# Patient Record
Sex: Female | Born: 1955 | Race: White | Hispanic: No | Marital: Single | Smoking: Current every day smoker
Health system: Southern US, Community
[De-identification: ages and names within clinical notes are randomized; demographics above are authoritative.]

## PROBLEM LIST (undated history)

## (undated) DIAGNOSIS — F319 Bipolar disorder, unspecified: Secondary | ICD-10-CM

## (undated) DIAGNOSIS — E039 Hypothyroidism, unspecified: Secondary | ICD-10-CM

## (undated) DIAGNOSIS — I10 Essential (primary) hypertension: Secondary | ICD-10-CM

## (undated) HISTORY — PX: BREAST BIOPSY: SHX20

## (undated) HISTORY — PX: ABDOMINAL HYSTERECTOMY: SHX81

## (undated) HISTORY — PX: OOPHORECTOMY: SHX86

## (undated) HISTORY — PX: TOTAL THYROIDECTOMY: SHX2547

---

## 2005-01-07 ENCOUNTER — Ambulatory Visit: Payer: Self-pay

## 2005-02-19 ENCOUNTER — Other Ambulatory Visit: Payer: Self-pay

## 2005-03-03 ENCOUNTER — Ambulatory Visit: Payer: Self-pay | Admitting: Unknown Physician Specialty

## 2005-03-05 ENCOUNTER — Ambulatory Visit: Payer: Self-pay | Admitting: Unknown Physician Specialty

## 2007-10-03 ENCOUNTER — Emergency Department: Payer: Self-pay | Admitting: Emergency Medicine

## 2010-02-21 ENCOUNTER — Emergency Department: Payer: Self-pay | Admitting: Internal Medicine

## 2010-08-05 ENCOUNTER — Ambulatory Visit: Payer: Self-pay | Admitting: Family Medicine

## 2010-08-19 ENCOUNTER — Ambulatory Visit: Payer: Self-pay | Admitting: Family Medicine

## 2010-08-20 ENCOUNTER — Encounter: Payer: Self-pay | Admitting: Family Medicine

## 2010-08-25 ENCOUNTER — Emergency Department: Payer: Self-pay | Admitting: Emergency Medicine

## 2010-09-14 ENCOUNTER — Encounter: Payer: Self-pay | Admitting: Family Medicine

## 2011-01-09 ENCOUNTER — Ambulatory Visit: Payer: Self-pay | Admitting: Family Medicine

## 2011-01-22 ENCOUNTER — Ambulatory Visit: Payer: Self-pay | Admitting: Specialist

## 2011-03-10 ENCOUNTER — Ambulatory Visit: Payer: Self-pay | Admitting: Family Medicine

## 2011-04-06 ENCOUNTER — Encounter: Payer: Self-pay | Admitting: Family Medicine

## 2011-04-15 ENCOUNTER — Encounter: Payer: Self-pay | Admitting: Family Medicine

## 2011-05-16 ENCOUNTER — Encounter: Payer: Self-pay | Admitting: Family Medicine

## 2011-08-18 ENCOUNTER — Ambulatory Visit: Payer: Self-pay | Admitting: Specialist

## 2012-03-01 ENCOUNTER — Ambulatory Visit: Payer: Self-pay | Admitting: Specialist

## 2012-09-16 ENCOUNTER — Ambulatory Visit: Payer: Self-pay | Admitting: Specialist

## 2013-07-27 DIAGNOSIS — G8929 Other chronic pain: Secondary | ICD-10-CM | POA: Diagnosis present

## 2013-07-27 DIAGNOSIS — E039 Hypothyroidism, unspecified: Secondary | ICD-10-CM | POA: Diagnosis present

## 2013-07-27 DIAGNOSIS — J449 Chronic obstructive pulmonary disease, unspecified: Secondary | ICD-10-CM | POA: Insufficient documentation

## 2013-10-17 ENCOUNTER — Ambulatory Visit: Payer: Self-pay | Admitting: Anesthesiology

## 2013-10-25 ENCOUNTER — Ambulatory Visit: Payer: Self-pay | Admitting: Anesthesiology

## 2013-11-15 ENCOUNTER — Ambulatory Visit: Payer: Self-pay | Admitting: Anesthesiology

## 2013-12-25 ENCOUNTER — Ambulatory Visit: Payer: Self-pay | Admitting: Anesthesiology

## 2014-02-22 ENCOUNTER — Ambulatory Visit: Payer: Self-pay | Admitting: Anesthesiology

## 2014-04-17 ENCOUNTER — Ambulatory Visit: Payer: Self-pay | Admitting: Anesthesiology

## 2014-06-05 DIAGNOSIS — I10 Essential (primary) hypertension: Secondary | ICD-10-CM | POA: Diagnosis present

## 2014-07-23 ENCOUNTER — Ambulatory Visit: Payer: Self-pay | Admitting: Anesthesiology

## 2014-10-11 ENCOUNTER — Ambulatory Visit: Payer: Self-pay | Admitting: Anesthesiology
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
## 2015-05-09 DIAGNOSIS — M792 Neuralgia and neuritis, unspecified: Secondary | ICD-10-CM | POA: Diagnosis present

## 2015-08-03 ENCOUNTER — Emergency Department
Admission: EM | Admit: 2015-08-03 | Discharge: 2015-08-03 | Disposition: A | Discharge status: Home / Self Care | Payer: Medicare Other | Attending: Emergency Medicine | Admitting: Emergency Medicine

## 2015-08-03 ENCOUNTER — Encounter: Payer: Self-pay | Admitting: Emergency Medicine

## 2015-08-03 ENCOUNTER — Emergency Department: Payer: Medicare Other

## 2015-08-03 DIAGNOSIS — R300 Dysuria: Secondary | ICD-10-CM | POA: Diagnosis not present

## 2015-08-03 DIAGNOSIS — R109 Unspecified abdominal pain: Secondary | ICD-10-CM | POA: Diagnosis present

## 2015-08-03 DIAGNOSIS — F17210 Nicotine dependence, cigarettes, uncomplicated: Secondary | ICD-10-CM | POA: Diagnosis not present

## 2015-08-03 LAB — URINALYSIS COMPLETE WITH MICROSCOPIC (ARMC ONLY)
BILIRUBIN URINE: NEGATIVE
Bacteria, UA: NONE SEEN
Glucose, UA: NEGATIVE mg/dL
Hgb urine dipstick: NEGATIVE
KETONES UR: NEGATIVE mg/dL
LEUKOCYTES UA: NEGATIVE
Nitrite: NEGATIVE
PH: 7 (ref 5.0–8.0)
Protein, ur: NEGATIVE mg/dL
Specific Gravity, Urine: 1.001 — ABNORMAL LOW (ref 1.005–1.030)

## 2015-08-03 LAB — BASIC METABOLIC PANEL
Anion gap: 8 (ref 5–15)
BUN: 7 mg/dL (ref 6–20)
CO2: 24 mmol/L (ref 22–32)
Calcium: 9.5 mg/dL (ref 8.9–10.3)
Chloride: 107 mmol/L (ref 101–111)
Creatinine, Ser: 0.71 mg/dL (ref 0.44–1.00)
GFR calc Af Amer: >60 mL/min (ref >60)
GFR calc non Af Amer: >60 mL/min (ref >60)
GLUCOSE: 108 mg/dL — AB (ref 65–99)
POTASSIUM: 3.1 mmol/L — AB (ref 3.5–5.1)
Sodium: 139 mmol/L (ref 135–145)

## 2015-08-03 LAB — CBC WITH DIFFERENTIAL/PLATELET
BASOS PCT: 1 %
Basophils Absolute: 0.1 10*3/uL (ref 0–0.1)
EOS ABS: 0.1 10*3/uL (ref 0–0.7)
Eosinophils Relative: 1 %
HEMATOCRIT: 43.2 % (ref 35.0–47.0)
HEMOGLOBIN: 14.2 g/dL (ref 12.0–16.0)
LYMPHS ABS: 1.6 10*3/uL (ref 1.0–3.6)
Lymphocytes Relative: 11 %
MCH: 31.1 pg (ref 26.0–34.0)
MCHC: 33 g/dL (ref 32.0–36.0)
MCV: 94.5 fL (ref 80.0–100.0)
MONO ABS: 0.9 10*3/uL (ref 0.2–0.9)
MONOS PCT: 7 %
Neutro Abs: 11.2 10*3/uL — ABNORMAL HIGH (ref 1.4–6.5)
Neutrophils Relative %: 80 %
Platelets: 294 10*3/uL (ref 150–440)
RBC: 4.58 MIL/uL (ref 3.80–5.20)
RDW: 15 % — AB (ref 11.5–14.5)
WBC: 13.8 10*3/uL — ABNORMAL HIGH (ref 3.6–11.0)

## 2015-08-03 MED ORDER — KETOROLAC TROMETHAMINE 30 MG/ML IJ SOLN
30.0000 mg | Freq: Once | INTRAMUSCULAR | Status: AC
Start: 2015-08-03 — End: 2015-08-03
  Administered 2015-08-03: 30 mg via INTRAVENOUS
  Filled 2015-08-03: qty 1

## 2015-08-03 MED ORDER — IBUPROFEN 800 MG PO TABS: 800.0000 mg | ORAL_TABLET | Freq: Three times a day (TID) | ORAL | Status: AC | PRN

## 2015-08-03 NOTE — Discharge Instructions (Signed)
Flank Pain °Flank pain refers to pain that is located on the side of the body between the upper abdomen and the back. The pain may occur over a short period of time (acute) or may be long-term or reoccurring (chronic). It may be mild or severe. Flank pain can be caused by many things. °CAUSES  °Some of the more common causes of flank pain include: °· Muscle strains.   °· Muscle spasms.   °· A disease of your spine (vertebral disk disease).   °· A lung infection (pneumonia).   °· Fluid around your lungs (pulmonary edema).   °· A kidney infection.   °· Kidney stones.   °· A very painful skin rash caused by the chickenpox virus (shingles).   °· Gallbladder disease.   °HOME CARE INSTRUCTIONS  °Home care will depend on the cause of your pain. In general, °· Rest as directed by your caregiver. °· Drink enough fluids to keep your urine clear or pale yellow. °· Only take over-the-counter or prescription medicines as directed by your caregiver. Some medicines may help relieve the pain. °· Tell your caregiver about any changes in your pain. °· Follow up with your caregiver as directed. °SEEK IMMEDIATE MEDICAL CARE IF:  °· Your pain is not controlled with medicine.   °· You have new or worsening symptoms. °· Your pain increases.   °· You have abdominal pain.   °· You have shortness of breath.   °· You have persistent nausea or vomiting.   °· You have swelling in your abdomen.   °· You feel faint or pass out.   °· You have blood in your urine. °· You have a fever or persistent symptoms for more than 2-3 days. °· You have a fever and your symptoms suddenly get worse. °MAKE SURE YOU:  °· Understand these instructions. °· Will watch your condition. °· Will get help right away if you are not doing well or get worse. °  °This information is not intended to replace advice given to you by your health care provider. Make sure you discuss any questions you have with your health care provider. °  °Document Released: 10/22/2005 Document  Revised: 05/25/2012 Document Reviewed: 04/14/2012 °Elsevier Interactive Patient Education ©2016 [employer] ° °

## 2015-08-03 NOTE — ED Notes (Signed)
Bilateral flank pain and dysuria x 3 days.  History of kidney infections.  Patient states she feels like she is having one now.  Denies fevers or chills or blood in urine.  No hx of kidney stones.

## 2015-08-04 NOTE — ED Provider Notes (Signed)
Time Seen: Approximately 2127 I have reviewed the triage notes  Chief Complaint: Flank Pain and Dysuria   History of Present Illness: Doris Jenkins is a 59 y.o. female three-day history of relatively constant bilateral flank pain. Patient states she's had a history of pyelonephritis and was concerned she may have an exacerbation. She denies any positional or reproducible component to her pain. She points to this historian over the lower back region without any significant lateralization but states is more toward the right side than the left. She denies any dysuria, hematuria, urinary frequency. She denies any bowel complaints such as diarrhea or constipation. She denies any anterior abdominal pain. She denies any shortness of breath or pleuritic component. She denies any leg weakness or trauma.*   History reviewed. No pertinent past medical history.  There are no active problems to display for this patient.   Past Surgical History  Procedure Laterality Date  . Abdominal hysterectomy    . Total thyroidectomy      Past Surgical History  Procedure Laterality Date  . Abdominal hysterectomy    . Total thyroidectomy      Current Outpatient Rx  Name  Route  Sig  Dispense  Refill  . ibuprofen (ADVIL,MOTRIN) 800 MG tablet   Oral   Take 1 tablet (800 mg total) by mouth every 8 (eight) hours as needed.   21 tablet   0     Allergies:  Sulfa antibiotics  Family History: No family history on file.  Social History: Social History  Substance Use Topics  . Smoking status: Current Every Day Smoker    Types: Cigarettes  . Smokeless tobacco: None  . Alcohol Use: No     Review of Systems:   10 point review of systems was performed and was otherwise negative:  Constitutional: No fever Eyes: No visual disturbances ENT: No sore throat, ear pain Cardiac: No chest pain Respiratory: No shortness of breath, wheezing, or stridor Abdomen: No abdominal pain, no vomiting, No  diarrhea Endocrine: No weight loss, No night sweats Extremities: No peripheral edema, cyanosis Skin: No rashes, easy bruising Neurologic: No focal weakness, trouble with speech or swollowing Urologic: No dysuria, Hematuria, or urinary frequency  Physical Exam:  ED Triage Vitals  Enc Vitals Group     BP 08/03/15 2013 143/95 mmHg     Pulse Rate 08/03/15 2013 93     Resp 08/03/15 2013 18     Temp 08/03/15 2013 98.4 F (36.9 C)     Temp Source 08/03/15 2013 Oral     SpO2 08/03/15 2013 99 %     Weight 08/03/15 2013 120 lb (54.432 kg)     Height 08/03/15 2013  (1.676 m)     Head Cir --      Peak Flow --      Pain Score 08/03/15 2014 10     Pain Loc --      Pain Edu? --      Excl. in GC? --     General: Awake , Alert , and Oriented times 3; GCS 15 Head: Normal cephalic , atraumatic Eyes: Pupils equal , round, reactive to light Nose/Throat: No nasal drainage, patent upper airway without erythema or exudate.  Neck: Supple, Full range of motion, No anterior adenopathy or palpable thyroid masses Lungs: Clear to ascultation without wheezes , rhonchi, or rales Heart: Regular rate, regular rhythm without murmurs , gallops , or rubs Abdomen: Soft, non tender without rebound, guarding , or rigidity;  bowel sounds positive and symmetric in all 4 quadrants. No organomegaly .        Extremities: 2 plus symmetric pulses. No edema, clubbing or cyanosis Neurologic: normal ambulation, Motor symmetric without deficits, sensory intact Skin: warm, dry, no rashes No obvious reproducible pain with palpation across the flank or midline back region without any rashes. No ecchymosis.  Labs:   All laboratory work was reviewed including any pertinent negatives or positives listed below:  Labs Reviewed  URINALYSIS COMPLETEWITH MICROSCOPIC ([facility] ONLY) - Abnormal; Notable for the following:    Color, Urine COLORLESS (*)    APPearance CLEAR (*)    Specific Gravity, Urine 1.001 (*)    Squamous  Epithelial / LPF 0-5 (*)    All other components within normal limits  BASIC METABOLIC PANEL - Abnormal; Notable for the following:    Potassium 3.1 (*)    Glucose, Bld 108 (*)    All other components within normal limits  CBC WITH DIFFERENTIAL/PLATELET - Abnormal; Notable for the following:    WBC 13.8 (*)    RDW 15.0 (*)    Neutro Abs 11.2 (*)    All other components within normal limits  URINE CULTURE   patient's white blood cell count was slightly elevated Review laboratory work shows overall within normal limits with some mild hypokalemia  Radiology: EXAM: CT ABDOMEN AND PELVIS WITHOUT CONTRAST  TECHNIQUE: Multidetector CT imaging of the abdomen and pelvis was performed following the standard protocol without IV contrast.  COMPARISON: Prior chest CTs dating back to 01/22/2011  FINDINGS: Lower chest: Emphysema noted. 7 mm left lower lobe pulmonary nodule stable, consistent with benign etiology.  Hepatobiliary: No mass visualized on this un-enhanced exam. Gallbladder is unremarkable.  Pancreas: No mass or inflammatory process identified on this un-enhanced exam.  Spleen: Within normal limits in size.  Adrenals/Urinary Tract: No evidence of urolithiasis or hydronephrosis. No definite mass visualized on this un-enhanced exam.  Stomach/Bowel: No evidence of obstruction, inflammatory process, or abnormal fluid collections. Normal appendix visualized.  Vascular/Lymphatic: No pathologically enlarged lymph nodes. No evidence of abdominal aortic aneurysm.  Reproductive: Prior hysterectomy noted. Adnexal regions are unremarkable in appearance.  Other: None.  Musculoskeletal: No suspicious bone lesions identified.  IMPRESSION: No evidence of urolithiasis, hydronephrosis, or other acute findings within the abdomen or pelvis       I personally reviewed the radiologic studies     ED Course: Patient's stay here showed symptomatic improvement with some  low-dose Toradol. I'm not sure of the nature of the cause of her pain at this time though did add a urine culture due to her past history. Her urine here shows no obvious infectious findings and the patient's abdominal CT shows no indications of renal colic or intra-abdominal pathology. I felt the patient could be treated on an outpatient basis with nonsteroidal therapy. She was advised to return here if she develops a fever, abdominal pain, chest pain or shortness of breath, weakness or any other new concerns. The time being we felt this most likely is musculoskeletal back pain with no significant risk factors for epidural abscess etc.    Assessment:  Musculoskeletal back pain*   Final Clinical Impression:   Final diagnoses:  Right flank pain     Plan: * Outpatient management Patient was advised to return immediately if condition worsens. Patient was advised to follow up with her primary care physician or other specialized physicians involved and in their current assessment.  Jennye Moccasin, MD 08/04/15 [phone]

## 2015-08-05 LAB — URINE CULTURE

## 2016-06-05 ENCOUNTER — Emergency Department: Payer: Medicare Other

## 2016-06-05 ENCOUNTER — Observation Stay
Admission: EM | Admit: 2016-06-05 | Discharge: 2016-06-07 | Disposition: A | Discharge status: Home Health | Payer: Medicare Other | Attending: Internal Medicine | Admitting: Internal Medicine

## 2016-06-05 ENCOUNTER — Encounter: Payer: Self-pay | Admitting: Emergency Medicine

## 2016-06-05 DIAGNOSIS — Z79899 Other long term (current) drug therapy: Secondary | ICD-10-CM | POA: Insufficient documentation

## 2016-06-05 DIAGNOSIS — G9340 Encephalopathy, unspecified: Secondary | ICD-10-CM | POA: Diagnosis not present

## 2016-06-05 DIAGNOSIS — R251 Tremor, unspecified: Secondary | ICD-10-CM | POA: Insufficient documentation

## 2016-06-05 DIAGNOSIS — R410 Disorientation, unspecified: Secondary | ICD-10-CM

## 2016-06-05 DIAGNOSIS — F319 Bipolar disorder, unspecified: Secondary | ICD-10-CM | POA: Diagnosis not present

## 2016-06-05 DIAGNOSIS — F13231 Sedative, hypnotic or anxiolytic dependence with withdrawal delirium: Secondary | ICD-10-CM | POA: Insufficient documentation

## 2016-06-05 DIAGNOSIS — E860 Dehydration: Secondary | ICD-10-CM | POA: Insufficient documentation

## 2016-06-05 DIAGNOSIS — E039 Hypothyroidism, unspecified: Secondary | ICD-10-CM | POA: Diagnosis not present

## 2016-06-05 DIAGNOSIS — F17210 Nicotine dependence, cigarettes, uncomplicated: Secondary | ICD-10-CM | POA: Insufficient documentation

## 2016-06-05 LAB — COMPREHENSIVE METABOLIC PANEL
ALT: 10 U/L — ABNORMAL LOW (ref 14–54)
AST: 16 U/L (ref 15–41)
Albumin: 4.8 g/dL (ref 3.5–5.0)
Alkaline Phosphatase: 109 U/L (ref 38–126)
Anion gap: 10 (ref 5–15)
BUN: 29 mg/dL — AB (ref 6–20)
CHLORIDE: 113 mmol/L — AB (ref 101–111)
CO2: 19 mmol/L — ABNORMAL LOW (ref 22–32)
Calcium: 9.5 mg/dL (ref 8.9–10.3)
Creatinine, Ser: 0.9 mg/dL (ref 0.44–1.00)
GFR calc Af Amer: >60 mL/min (ref >60)
Glucose, Bld: 144 mg/dL — ABNORMAL HIGH (ref 65–99)
Potassium: 3.7 mmol/L (ref 3.5–5.1)
Sodium: 142 mmol/L (ref 135–145)
Total Bilirubin: 1.2 mg/dL (ref 0.3–1.2)
Total Protein: 7.5 g/dL (ref 6.5–8.1)

## 2016-06-05 LAB — GLUCOSE, CAPILLARY: Glucose-Capillary: 131 mg/dL — ABNORMAL HIGH (ref 65–99)

## 2016-06-05 LAB — CBC WITH DIFFERENTIAL/PLATELET
BASOS ABS: 0 10*3/uL (ref 0–0.1)
BASOS PCT: 0 %
EOS ABS: 0 10*3/uL (ref 0–0.7)
Eosinophils Relative: 0 %
HCT: 45.7 % (ref 35.0–47.0)
Hemoglobin: 14.9 g/dL (ref 12.0–16.0)
Lymphocytes Relative: 3 %
Lymphs Abs: 0.4 10*3/uL — ABNORMAL LOW (ref 1.0–3.6)
MCH: 31.6 pg (ref 26.0–34.0)
MCHC: 32.7 g/dL (ref 32.0–36.0)
MCV: 96.7 fL (ref 80.0–100.0)
MONO ABS: 0.7 10*3/uL (ref 0.2–0.9)
Monocytes Relative: 5 %
Neutro Abs: 12.5 10*3/uL — ABNORMAL HIGH (ref 1.4–6.5)
Neutrophils Relative %: 92 %
PLATELETS: 354 10*3/uL (ref 150–440)
RBC: 4.73 MIL/uL (ref 3.80–5.20)
RDW: 16.3 % — AB (ref 11.5–14.5)
WBC: 13.7 10*3/uL — ABNORMAL HIGH (ref 3.6–11.0)

## 2016-06-05 LAB — TSH: TSH: 5.763 u[IU]/mL — ABNORMAL HIGH (ref 0.350–4.500)

## 2016-06-05 LAB — ETHANOL

## 2016-06-05 LAB — LITHIUM LEVEL: Lithium Lvl: 0.63 mmol/L (ref 0.60–1.20)

## 2016-06-05 MED ORDER — SODIUM CHLORIDE 0.9 % IV BOLUS (SEPSIS)
1000.0000 mL | Freq: Once | INTRAVENOUS | Status: AC
  Administered 2016-06-05: 1000 mL via INTRAVENOUS

## 2016-06-05 MED ORDER — LORAZEPAM 2 MG/ML IJ SOLN
0.5000 mg | Freq: Once | INTRAMUSCULAR | Status: AC
  Administered 2016-06-05: 0.5 mg via INTRAVENOUS
  Filled 2016-06-05: qty 1

## 2016-06-05 NOTE — Progress Notes (Addendum)
Patient code stroke was admitted accompanied with his grandson. I visited with her and offered spiritual support to her, her daughter and grandson. I visited her again after 2 hrs. She was in good mood and expressed to me that she was feeling better and wanted to go home.

## 2016-06-05 NOTE — ED Notes (Signed)
Updated pt. Questions answered. Pt still "twitching" . Vitals updated

## 2016-06-05 NOTE — ED Triage Notes (Signed)
Pt a&o, tremors noted.  Brought in by son, states she had her bottom teeth removed recently and has had trouble eating since.  States she seemed confused and paranoid and is concerned about her being dehydrated.
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
## 2016-06-06 DIAGNOSIS — G9340 Encephalopathy, unspecified: Secondary | ICD-10-CM | POA: Diagnosis present

## 2016-06-06 DIAGNOSIS — F319 Bipolar disorder, unspecified: Secondary | ICD-10-CM

## 2016-06-06 DIAGNOSIS — R410 Disorientation, unspecified: Secondary | ICD-10-CM

## 2016-06-06 LAB — URINALYSIS COMPLETE WITH MICROSCOPIC (ARMC ONLY)
Bilirubin Urine: NEGATIVE
Glucose, UA: NEGATIVE mg/dL
Hgb urine dipstick: NEGATIVE
Ketones, ur: NEGATIVE mg/dL
Leukocytes, UA: NEGATIVE
Nitrite: NEGATIVE
Protein, ur: NEGATIVE mg/dL
Specific Gravity, Urine: 1.01 (ref 1.005–1.030)
pH: 7 (ref 5.0–8.0)

## 2016-06-06 LAB — URINE DRUG SCREEN, QUALITATIVE (ARMC ONLY)
Amphetamines, Ur Screen: NOT DETECTED
Barbiturates, Ur Screen: NOT DETECTED
Benzodiazepine, Ur Scrn: POSITIVE — AB
Cannabinoid 50 Ng, Ur ~~LOC~~: POSITIVE — AB
Cocaine Metabolite,Ur ~~LOC~~: NOT DETECTED
MDMA (Ecstasy)Ur Screen: NOT DETECTED
Methadone Scn, Ur: NOT DETECTED
Opiate, Ur Screen: NOT DETECTED
Phencyclidine (PCP) Ur S: NOT DETECTED
Tricyclic, Ur Screen: POSITIVE — AB

## 2016-06-06 LAB — TSH: TSH: 5.645 u[IU]/mL — ABNORMAL HIGH (ref 0.350–4.500)

## 2016-06-06 LAB — AMMONIA: Ammonia: 19 umol/L (ref 9–35)

## 2016-06-06 LAB — T4, FREE: FREE T4: 0.62 ng/dL (ref 0.61–1.12)

## 2016-06-06 MED ORDER — ALPRAZOLAM 0.5 MG PO TABS
2.0000 mg | ORAL_TABLET | Freq: Once | ORAL | Status: AC
  Administered 2016-06-06: 2 mg via ORAL
  Filled 2016-06-06: qty 4

## 2016-06-06 MED ORDER — LABETALOL HCL 5 MG/ML IV SOLN
5.0000 mg | INTRAVENOUS | Status: DC | PRN
  Administered 2016-06-06: 5 mg via INTRAVENOUS
  Filled 2016-06-06 (×2): qty 4

## 2016-06-06 MED ORDER — ONDANSETRON HCL 4 MG PO TABS: 4.0000 mg | ORAL_TABLET | Freq: Four times a day (QID) | ORAL | Status: DC | PRN

## 2016-06-06 MED ORDER — QUETIAPINE FUMARATE 200 MG PO TABS
200.0000 mg | ORAL_TABLET | Freq: Every day | ORAL | Status: DC
  Administered 2016-06-06: 200 mg via ORAL
  Filled 2016-06-06: qty 1

## 2016-06-06 MED ORDER — LEVOTHYROXINE SODIUM 75 MCG PO TABS
75.0000 ug | ORAL_TABLET | Freq: Every day | ORAL | Status: DC
  Administered 2016-06-06 – 2016-06-07 (×2): 75 ug via ORAL
  Filled 2016-06-06 (×2): qty 1

## 2016-06-06 MED ORDER — FLUOXETINE HCL 20 MG PO CAPS
40.0000 mg | ORAL_CAPSULE | Freq: Every day | ORAL | Status: DC
  Administered 2016-06-06 – 2016-06-07 (×2): 40 mg via ORAL
  Filled 2016-06-06 (×2): qty 2

## 2016-06-06 MED ORDER — DOCUSATE SODIUM 100 MG PO CAPS
100.0000 mg | ORAL_CAPSULE | Freq: Two times a day (BID) | ORAL | Status: DC
Start: 2016-06-06 — End: 2016-06-07
  Administered 2016-06-06: 09:00:00 100 mg via ORAL
  Filled 2016-06-06 (×3): qty 1

## 2016-06-06 MED ORDER — LITHIUM CARBONATE 300 MG PO CAPS
300.0000 mg | ORAL_CAPSULE | Freq: Two times a day (BID) | ORAL | Status: DC
  Administered 2016-06-06 (×2): 300 mg via ORAL
  Filled 2016-06-06 (×3): qty 1

## 2016-06-06 MED ORDER — TIZANIDINE HCL 2 MG PO TABS: 2.0000 mg | ORAL_TABLET | Freq: Three times a day (TID) | ORAL | Status: DC | PRN

## 2016-06-06 MED ORDER — ENOXAPARIN SODIUM 40 MG/0.4ML ~~LOC~~ SOLN
40.0000 mg | SUBCUTANEOUS | Status: DC
  Administered 2016-06-06: 21:00:00 40 mg via SUBCUTANEOUS
  Filled 2016-06-06: qty 0.4

## 2016-06-06 MED ORDER — ACETAMINOPHEN 325 MG PO TABS: 650.0000 mg | ORAL_TABLET | Freq: Four times a day (QID) | ORAL | Status: DC | PRN

## 2016-06-06 MED ORDER — ALBUTEROL SULFATE (2.5 MG/3ML) 0.083% IN NEBU
2.5000 mg | INHALATION_SOLUTION | Freq: Four times a day (QID) | RESPIRATORY_TRACT | Status: DC | PRN
Start: 2016-06-06 — End: 2016-06-07

## 2016-06-06 MED ORDER — ALPRAZOLAM 0.5 MG PO TABS: 0.5000 mg | ORAL_TABLET | Freq: Four times a day (QID) | ORAL | Status: DC | PRN

## 2016-06-06 MED ORDER — SODIUM CHLORIDE 0.9 % IV SOLN
INTRAVENOUS | Status: DC
  Administered 2016-06-06 – 2016-06-07 (×3): via INTRAVENOUS

## 2016-06-06 MED ORDER — ACETAMINOPHEN 650 MG RE SUPP: 650.0000 mg | Freq: Four times a day (QID) | RECTAL | Status: DC | PRN

## 2016-06-06 MED ORDER — LITHIUM CARBONATE 300 MG PO TABS: 300.0000 mg | ORAL_TABLET | Freq: Two times a day (BID) | ORAL | Status: DC

## 2016-06-06 MED ORDER — IBUPROFEN 600 MG PO TABS: 600.0000 mg | ORAL_TABLET | Freq: Three times a day (TID) | ORAL | Status: DC | PRN

## 2016-06-06 MED ORDER — ALPRAZOLAM 1 MG PO TABS
1.0000 mg | ORAL_TABLET | Freq: Two times a day (BID) | ORAL | Status: DC
  Administered 2016-06-06: 09:00:00 1 mg via ORAL
  Filled 2016-06-06: qty 1

## 2016-06-06 MED ORDER — ALPRAZOLAM 0.5 MG PO TABS
0.5000 mg | ORAL_TABLET | Freq: Two times a day (BID) | ORAL | Status: DC
  Administered 2016-06-06 – 2016-06-07 (×2): 0.5 mg via ORAL
  Filled 2016-06-06 (×2): qty 1

## 2016-06-06 MED ORDER — ONDANSETRON HCL 4 MG/2ML IJ SOLN: 4.0000 mg | Freq: Four times a day (QID) | INTRAMUSCULAR | Status: DC | PRN

## 2016-06-06 MED ORDER — QUETIAPINE FUMARATE 200 MG PO TABS: 400.0000 mg | ORAL_TABLET | Freq: Every day | ORAL | Status: DC

## 2016-06-06 NOTE — ED Notes (Signed)
Report given to floor RN. Pt taken to floor via stretcher. Vital signs stable prior to transport.  

## 2016-06-06 NOTE — Progress Notes (Signed)
Patient ambulated with PT this shift, OOB to chair. Urine tox. positive, see results

## 2016-06-06 NOTE — ED Notes (Signed)
[facility]

## 2016-06-06 NOTE — Evaluation (Signed)
Physical Therapy Evaluation Patient Details Name: Doris Jenkins MRN: [id number] DOB: 03/08/1956 Today's Date: 06/06/2016   History of Present Illness  60 yo female with onset of encephalopathy and emphysema with lung fibrosis noted on imaging.  Concerns for medication withdrawal due to her bottle of meds being out.  PMHx: bipolar, tremors, hypothyroidism  Clinical Impression  Pt is up to walk with PT and in chair, has some clear safety issues of her strength and control of balance.  Her plan is to continue PT acutely to work on walking and strengthening, then transition to HHPT and will have some family support for her care initially as she lives alone.  Will anticipate her transition to safe gait as she returns to her usual medication schedule.    Follow Up Recommendations Home health PT;Supervision for mobility/OOB    Equipment Recommendations  Rolling walker with 5" wheels    Recommendations for Other Services Rehab consult     Precautions / Restrictions Precautions Precautions: Fall Precaution Comments: pt is up in chair with alarm Restrictions Weight Bearing Restrictions: No      Mobility  Bed Mobility Overal bed mobility: Needs Assistance Bed Mobility: Supine to Sit     Supine to sit: Min assist;Min guard     General bed mobility comments: cued and assisted to bedside  Transfers Overall transfer level: Needs assistance Equipment used: 1 person hand held assist (IV pole) Transfers: Sit to/from Stand;Stand Pivot Transfers Sit to Stand: Min assist Stand pivot transfers: Min assist       General transfer comment: cued hand placement and control of IV pole  Ambulation/Gait Ambulation/Gait assistance: Min guard;Min assist Ambulation Distance (Feet): 80 Feet Assistive device: 1 person hand held assist (IV pole) Gait Pattern/deviations: Step-through pattern;Narrow base of support;Trunk flexed;Decreased stride length Gait velocity: decreased Gait velocity  interpretation: Below normal speed for age/gender    Stairs            Wheelchair Mobility    Modified Rankin (Stroke Patients Only)       Balance Overall balance assessment: Needs assistance Sitting-balance support: Feet supported Sitting balance-Leahy Scale: Fair   Postural control: Posterior lean Standing balance support: Bilateral upper extremity supported Standing balance-Leahy Scale: Poor                               Pertinent Vitals/Pain Pain Assessment: No/denies pain    Home Living Family/patient expects to be discharged to:: Private residence Living Arrangements: Alone Available Help at Discharge: Family;Available PRN/intermittently Type of Home: House Home Access: Stairs to enter Entrance Stairs-Rails: Right;Left;Can reach both Entrance Stairs-Number of Steps: 4 Home Layout: One level Home Equipment: None Additional Comments: son is there and offers to take her home with him at DC    Prior Function Level of Independence: Independent               Hand Dominance   Dominant Hand: Right    Extremity/Trunk Assessment   Upper Extremity Assessment: Overall WFL for tasks assessed           Lower Extremity Assessment: Generalized weakness      Cervical / Trunk Assessment: Normal  Communication   Communication: No difficulties  Cognition Arousal/Alertness: Awake/alert Behavior During Therapy: Anxious Overall Cognitive Status: History of cognitive impairments - at baseline       Memory: Decreased short-term memory;Decreased recall of precautions  General Comments General comments (skin integrity, edema, etc.): Pt is up for the first time to walk today and may look considerably more secure to walk with RW after this visit, nursing aware of her needs for assistance.    Exercises     Assessment/Plan    PT Assessment Patient needs continued PT services  PT Problem List Decreased strength;Decreased  range of motion;Decreased activity tolerance;Decreased balance;Decreased mobility;Decreased coordination;Decreased knowledge of use of DME;Decreased safety awareness          PT Treatment Interventions DME instruction;Gait training;Stair training;Functional mobility training;Therapeutic activities;Therapeutic exercise;Balance training;Neuromuscular re-education;Patient/family education    PT Goals (Current goals can be found in the Care Plan section)  Acute Rehab PT Goals Patient Stated Goal: To try to walk PT Goal Formulation: With patient/family Time For Goal Achievement: 06/20/16 Potential to Achieve Goals: Good    Frequency Min 2X/week   Barriers to discharge Decreased caregiver support;Inaccessible home environment has son to help her initially    Co-evaluation               End of Session Equipment Utilized During Treatment: Gait belt Activity Tolerance: Patient tolerated treatment well;Patient limited by fatigue Patient left: in chair;with call bell/phone within reach;with chair alarm set;with family/visitor present Nurse Communication: Mobility status;Other (comment) (walker in room for further walks)         Time: 1610-9604 PT Time Calculation (min) (ACUTE ONLY): 25 min   Charges:   PT Evaluation $PT Eval Low Complexity: 1 Procedure PT Treatments $Gait Training: 8-22 mins   PT G Codes:        Ivar Drape 07/01/16, 3:18 PM    Samul Dada, PT MS Acute Rehab Dept. Number: [facility] [id number] and [facility] [phone]

## 2016-06-06 NOTE — Consult Note (Signed)
Reason for Consult:tremor Referring Physician: Dr. Clint Guy  CC: confusion and tremor   HPI: Doris Jenkins is an 60 y.o. female with a psychiatric history on lithium as well as hypothyroidism on levothyroxine and was presented with tremulousness as well as altered mental status. The family says that the patient's condition and acutely worsened today. They say that she is intermittently lucid and then will not make sense with her speech.  On current observation mentation much improve and as per family at bedside she is close to baseline.   History reviewed. No pertinent past medical history.  Past Surgical History:  Procedure Laterality Date  . ABDOMINAL HYSTERECTOMY    . TOTAL THYROIDECTOMY      History reviewed. No pertinent family history.  Social History:  reports that she has been smoking Cigarettes.  She has never used smokeless tobacco. She reports that she does not drink alcohol or use drugs.  Allergies  Allergen Reactions  . Sulfa Antibiotics Itching    Medications: I have reviewed the patient's current medications.  ROS: History obtained from the patient  General ROS: negative for - chills, fatigue, fever, night sweats, weight gain or weight loss Psychological ROS:hx of bipolar d/o Ophthalmic ROS: negative for - blurry vision, double vision, eye pain or loss of vision ENT ROS: negative for - epistaxis, nasal discharge, oral lesions, sore throat, tinnitus or vertigo Allergy and Immunology ROS: negative for - hives or itchy/watery eyes Hematological and Lymphatic ROS: negative for - bleeding problems, bruising or swollen lymph nodes Endocrine ROS: negative for - galactorrhea, hair pattern changes, polydipsia/polyuria or temperature intolerance Respiratory ROS: negative for - cough, hemoptysis, shortness of breath or wheezing Cardiovascular ROS: negative for - chest pain, dyspnea on exertion, edema or irregular heartbeat Gastrointestinal ROS: negative for - abdominal pain,  diarrhea, hematemesis, nausea/vomiting or stool incontinence Genito-Urinary ROS: negative for - dysuria, hematuria, incontinence or urinary frequency/urgency Musculoskeletal ROS: negative for - joint swelling or muscular weakness Neurological ROS: as noted in HPI Dermatological ROS: negative for rash and skin lesion changes  Physical Examination: Blood pressure (!) 147/83, pulse 93, temperature 98.3 F (36.8 C), temperature source Oral, resp. rate 18, height 5\' 5"  (1.651 m), weight 51.9 kg (114 lb 8 oz), SpO2 97 %.   Neurological Examination Mental Status: Alert, oriented, thought content appropriate.  Speech fluent without evidence of aphasia.  Able to follow 3 step commands without difficulty. Cranial Nerves: II: Discs flat bilaterally; Visual fields grossly normal, pupils equal, round, reactive to light and accommodation III,IV, VI: ptosis not present, extra-ocular motions intact bilaterally V,VII: smile symmetric, facial light touch sensation normal bilaterally VIII: hearing normal bilaterally IX,X: gag reflex present XI: bilateral shoulder shrug XII: midline tongue extension Motor: Right : Upper extremity   5/5    Left:     Upper extremity   5/5  Lower extremity   5/5     Lower extremity   5/5 Tone and bulk:normal tone throughout; no atrophy noted Sensory: Pinprick and light touch intact throughout, bilaterally Deep Tendon Reflexes: 1+ and symmetric throughout Plantars: Right: downgoing   Left: downgoing Cerebellar: normal finger-to-nose, normal rapid alternating movements and normal heel-to-shin test Gait: not tested      Laboratory Studies:   Basic Metabolic Panel:  Recent Labs Lab 06/05/16 1833  NA 142  K 3.7  CL 113*  CO2 19*  GLUCOSE 144*  BUN 29*  CREATININE 0.90  CALCIUM 9.5    Liver Function Tests:  Recent Labs Lab 06/05/16 1833  AST 16  ALT 10*  ALKPHOS 109  BILITOT 1.2  PROT 7.5  ALBUMIN 4.8   No results for input(s): LIPASE, AMYLASE in  the last 168 hours.  Recent Labs Lab 06/06/16 0023  AMMONIA 19    CBC:  Recent Labs Lab 06/05/16 1833  WBC 13.7*  NEUTROABS 12.5*  HGB 14.9  HCT 45.7  MCV 96.7  PLT 354    Cardiac Enzymes: No results for input(s): CKTOTAL, CKMB, CKMBINDEX, TROPONINI in the last 168 hours.  BNP: Invalid input(s): POCBNP  CBG:  Recent Labs Lab 06/05/16 1832  GLUCAP 131*    Microbiology: Results for orders placed or performed during the hospital encounter of 08/03/15  Urine culture     Status: None   Collection Time: 08/03/15  9:15 PM  Result Value Ref Range Status   Specimen Description URINE, RANDOM  Final   Special Requests NONE  Final   Culture MULTIPLE SPECIES PRESENT, SUGGEST RECOLLECTION  Final   Report Status 08/05/2015 FINAL  Final    Coagulation Studies: No results for input(s): LABPROT, INR in the last 72 hours.  Urinalysis:  Recent Labs Lab 06/06/16 1212  COLORURINE YELLOW*  LABSPEC 1.010  PHURINE 7.0  GLUCOSEU NEGATIVE  HGBUR NEGATIVE  BILIRUBINUR NEGATIVE  KETONESUR NEGATIVE  PROTEINUR NEGATIVE  NITRITE NEGATIVE  LEUKOCYTESUR NEGATIVE    Lipid Panel:  No results found for: CHOL, TRIG, HDL, CHOLHDL, VLDL, LDLCALC  HgbA1C: No results found for: HGBA1C  Urine Drug Screen:     Component Value Date/Time   LABOPIA NONE DETECTED 06/06/2016 1212   COCAINSCRNUR NONE DETECTED 06/06/2016 1212   LABBENZ POSITIVE (A) 06/06/2016 1212   AMPHETMU NONE DETECTED 06/06/2016 1212   THCU POSITIVE (A) 06/06/2016 1212   LABBARB NONE DETECTED 06/06/2016 1212    Alcohol Level:  Recent Labs Lab 06/05/16 1833  ETH <5      Imaging: Dg Chest 1 View  Result Date: 06/05/2016 CLINICAL DATA:  Altered mental status and weakness today. EXAM: CHEST 1 VIEW COMPARISON:  Chest CT 10/13/2012.  Chest 08/05/2010 FINDINGS: Emphysematous changes in the lungs with scattered fibrosis throughout. Normal heart size and pulmonary vascularity. No focal airspace disease or  consolidation in the lungs. No blunting of costophrenic angles. No pneumothorax. Mediastinal contours appear intact. Calcification of the aorta. IMPRESSION: Diffuse emphysematous change and fibrosis in the lungs. No evidence of active consolidation. Electronically Signed   By: Burman NievesWilliam  Stevens M.D.   On: 06/05/2016 21:49   Ct Head Wo Contrast  Result Date: 06/05/2016 CLINICAL DATA:  Confusion/altered mental status EXAM: CT HEAD WITHOUT CONTRAST TECHNIQUE: Contiguous axial images were obtained from the base of the skull through the vertex without intravenous contrast. COMPARISON:  January 09, 2011 FINDINGS: Brain: The ventricles are normal in size and configuration. There is no intracranial mass, hemorrhage, extra-axial fluid collection, or midline shift. Gray-white compartments appear normal. No acute infarct evident. Vascular: There is no hyperdense vessel. There are foci of calcification in each carotid siphon. Skull: The bony calvarium appears intact. Sinuses/Orbits: Visualized paranasal sinuses are clear. There is rightward deviation of the posterior nasal septum. Orbits appear symmetric bilaterally. Other: Visualized mastoid air cells are clear. IMPRESSION: No intracranial mass, hemorrhage, or focal gray -white compartment lesion. Mild arterial vascular calcification. Rightward deviation nasal septum. Electronically Signed   By: Bretta BangWilliam  Woodruff III M.D.   On: 06/05/2016 22:00     Assessment/Plan:   HPI: Alan Ripperamela J Piscitelli is an 60 y.o. female with a psychiatric history on lithium as  well as hypothyroidism on levothyroxine and was presented with tremulousness as well as altered mental status. The family says that the patient's condition and acutely worsened today. They say that she is intermittently lucid and then will not make sense with her speech.  On current observation mentation much improve and as per family at bedside she is close to baseline.  According to family pt lives alone.   This is  likely polypharmacy related/medication overuse Mentation much improved with hydration No further imaging from neuro stand point. CTH reviewed.      06/06/2016, 1:50 PM

## 2016-06-06 NOTE — Consult Note (Signed)
[facility] Face-to-Face Psychiatry Consult   Reason for Consult:  Consult for this 60 year old woman with a history of mental health problems brought into the hospital by family because of acute confusion. Referring Physician:  Hower Patient Identification: Doris Jenkins MRN:  [id number] Principal Diagnosis: Acute delirium Diagnosis:   Patient Active Problem List   Diagnosis Date Noted  . Encephalopathy [G93.40] 06/06/2016  . Acute delirium [R41.0] 06/06/2016  . Bipolar disorder ([place]) [F31.9] 06/06/2016    Total Time spent with patient: 1 hour  Subjective:   Doris Jenkins is a 60 y.o. female patient admitted with "I guess my medicine got mixed up".  HPI:  Patient interviewed. Chart reviewed. Labs and vitals reviewed. 60 year old woman with a history of mental health problems and a possible diagnosis of bipolar disorder. Family brought her into the hospital reporting that she had recently been more confused and was talking out of her head and acting incoherent. When I saw the patient today she appears to have been improved from that although she is still intermittently confused. The patient knows that she was brought into the hospital by her family. She says that recently she has been "erratic" and that she has been having problems with her behavior. She knows she has been intermittently confused. She tells me she has been feeling a little bit sad and her mood is been worse recently but denies any problems with her sleep. She initially denied any problems with her appetite and then admitted that she's been losing weight recently. She denies having any suicidal thoughts and says she has several positive things to live for. Denies being aware of any hallucinations. Patient is markedly confused about her medications. She thinks she is supposed to be taking lithium and trazodone and Xanax. This is a little bit different than the medicines listed in the intake. Also when I went on the controlled substance  database there is no evidence that she's been prescribed any controlled substances in the last couple months. She claims that her current psychiatrist is Dr. Toy Care in [place]. She tells me that her pharmacy is "[facility]". I ask her several times if he could have some other name and she insisted this was the correct name. As far as I can tell there is no such place. She admits that she drinks alcohol regularly. When I tried to pin her down about it she absolutely refused to give me any kind of estimate. She admits that she uses marijuana on a regular basis but also minimizes that. She denies that she's been misusing medicine but admits that she could get confused because she lives by herself and takes care of her own medication.  Medical history: Patient has a history of hypothyroidism. She had some oral surgery and got all of her lower teeth pulled a couple months ago. She mentioned that multiple times to me as being a possible cause of her illness although she denies that she's been taking any pain medication for it and denies that she is prescribed any pain medicine  Social history: Lives by herself. Has a son who lives nearby whom she says checks up on her fairly regularly but not every single day.  Substance abuse history: There is nothing in the chart that I can find clearly documented about this. She admitted to me that she drinks alcohol but as I said before refuses to pin herself down as to an amount. She admits that she uses marijuana but denies any other drug use. Her  drug screen is positive for marijuana and benzodiazepines. She denies any kind of alcohol withdrawal problems in the past. Denies being in substance abuse treatment.  Past Psychiatric History: The details of her psychiatric history are a little vague. She says she is currently seeing a psychiatrist in [place]. I know that that doctor's office would not be open on Saturday so I can't call to confirm that. Patient does not give  a clear report of what pharmacy she goes to. There is no record of her getting any controlled substances recently. She does have a positive lithium level however of 0.6 at presentation. She clearly has been given a diagnosis of bipolar disorder in the past. I see records that she used to be treated by someone at [facility] but it's been a couple years ago. I see records of various medicines including antidepressants, lamotrigine and lithium prescribed in the past. She says she has been admitted to psychiatric hospitals in the past. The exact details of that are unclear. She denies that she's ever tried to kill her self.  Risk to Self: Is patient at risk for suicide?: No Risk to Others:   Prior Inpatient Therapy:   Prior Outpatient Therapy:    Past Medical History: History reviewed. No pertinent past medical history.  Past Surgical History:  Procedure Laterality Date  . ABDOMINAL HYSTERECTOMY    . TOTAL THYROIDECTOMY     Family History: History reviewed. No pertinent family history. Family Psychiatric  History: She denies knowing of any family history of mental health problems Social History:  History  Alcohol Use No     History  Drug Use No    Social History   Social History  . Marital status: Single    Spouse name: N/A  . Number of children: N/A  . Years of education: N/A   Social History Main Topics  . Smoking status: Current Every Day Smoker    Types: Cigarettes  . Smokeless tobacco: Never Used  . Alcohol use No  . Drug use: No  . Sexual activity: Yes    Birth control/ protection: None   Other Topics Concern  . None   Social History Narrative  . None   Additional Social History:    Allergies:   Allergies  Allergen Reactions  . Sulfa Antibiotics Itching    Labs:  Results for orders placed or performed during the hospital encounter of 06/05/16 (from the past 48 hour(s))  Glucose, capillary     Status: Abnormal   Collection Time: 06/05/16  6:32 PM  Result Value Ref  Range   Glucose-Capillary 131 (H) 65 - 99 mg/dL  CBC with Differential     Status: Abnormal   Collection Time: 06/05/16  6:33 PM  Result Value Ref Range   WBC 13.7 (H) 3.6 - 11.0 K/uL   RBC 4.73 3.80 - 5.20 MIL/uL   Hemoglobin 14.9 12.0 - 16.0 g/dL   HCT 45.7 35.0 - 47.0 %   MCV 96.7 80.0 - 100.0 fL   MCH 31.6 26.0 - 34.0 pg   MCHC 32.7 32.0 - 36.0 g/dL   RDW 16.3 (H) 11.5 - 14.5 %   Platelets 354 150 - 440 K/uL   Neutrophils Relative % 92 %   Neutro Abs 12.5 (H) 1.4 - 6.5 K/uL   Lymphocytes Relative 3 %   Lymphs Abs 0.4 (L) 1.0 - 3.6 K/uL   Monocytes Relative 5 %   Monocytes Absolute 0.7 0.2 - 0.9 K/uL   Eosinophils Relative 0 %  Eosinophils Absolute 0.0 0 - 0.7 K/uL   Basophils Relative 0 %   Basophils Absolute 0.0 0 - 0.1 K/uL  Comprehensive metabolic panel     Status: Abnormal   Collection Time: 06/05/16  6:33 PM  Result Value Ref Range   Sodium 142 135 - 145 mmol/L   Potassium 3.7 3.5 - 5.1 mmol/L   Chloride 113 (H) 101 - 111 mmol/L   CO2 19 (L) 22 - 32 mmol/L   Glucose, Bld 144 (H) 65 - 99 mg/dL   BUN 29 (H) 6 - 20 mg/dL   Creatinine, Ser 0.90 0.44 - 1.00 mg/dL   Calcium 9.5 8.9 - 10.3 mg/dL   Total Protein 7.5 6.5 - 8.1 g/dL   Albumin 4.8 3.5 - 5.0 g/dL   AST 16 15 - 41 U/L   ALT 10 (L) 14 - 54 U/L   Alkaline Phosphatase 109 38 - 126 U/L   Total Bilirubin 1.2 0.3 - 1.2 mg/dL   GFR calc non Af Amer >60 >60 mL/min   GFR calc Af Amer >60 >60 mL/min    Comment: (NOTE) The eGFR has been calculated using the CKD EPI equation. This calculation has not been validated in all clinical situations. eGFR's persistently <60 mL/min signify possible Chronic Kidney Disease.    Anion gap 10 5 - 15  Ethanol     Status: None   Collection Time: 06/05/16  6:33 PM  Result Value Ref Range   Alcohol, Ethyl (B) <5 <5 mg/dL    Comment:        LOWEST DETECTABLE LIMIT FOR SERUM ALCOHOL IS 5 mg/dL FOR MEDICAL PURPOSES ONLY   Lithium level     Status: None   Collection Time:  06/05/16  6:33 PM  Result Value Ref Range   Lithium Lvl 0.63 0.60 - 1.20 mmol/L  TSH     Status: Abnormal   Collection Time: 06/05/16  6:33 PM  Result Value Ref Range   TSH 5.763 (H) 0.350 - 4.500 uIU/mL  Ammonia     Status: None   Collection Time: 06/06/16 12:23 AM  Result Value Ref Range   Ammonia 19 9 - 35 umol/L  TSH     Status: Abnormal   Collection Time: 06/06/16 12:23 AM  Result Value Ref Range   TSH 5.645 (H) 0.350 - 4.500 uIU/mL  T4, free     Status: None   Collection Time: 06/06/16 12:23 AM  Result Value Ref Range   Free T4 0.62 0.61 - 1.12 ng/dL    Comment: (NOTE) Biotin ingestion may interfere with free T4 tests. If the results are inconsistent with the TSH level, previous test results, or the clinical presentation, then consider biotin interference. If needed, order repeat testing after stopping biotin.   Urinalysis complete, with microscopic ([facility] only)     Status: Abnormal   Collection Time: 06/06/16 12:12 PM  Result Value Ref Range   Color, Urine YELLOW (A) YELLOW   APPearance CLEAR (A) CLEAR   Glucose, UA NEGATIVE NEGATIVE mg/dL   Bilirubin Urine NEGATIVE NEGATIVE   Ketones, ur NEGATIVE NEGATIVE mg/dL   Specific Gravity, Urine 1.010 1.005 - 1.030   Hgb urine dipstick NEGATIVE NEGATIVE   pH 7.0 5.0 - 8.0   Protein, ur NEGATIVE NEGATIVE mg/dL   Nitrite NEGATIVE NEGATIVE   Leukocytes, UA NEGATIVE NEGATIVE   RBC / HPF 0-5 0 - 5 RBC/hpf   WBC, UA 0-5 0 - 5 WBC/hpf   Bacteria, UA RARE (A) NONE SEEN  Squamous Epithelial / LPF 0-5 (A) NONE SEEN   Mucous PRESENT   Urine Drug Screen, Qualitative ([facility] only)     Status: Abnormal   Collection Time: 06/06/16 12:12 PM  Result Value Ref Range   Tricyclic, Ur Screen POSITIVE (A) NONE DETECTED   Amphetamines, Ur Screen NONE DETECTED NONE DETECTED   MDMA (Ecstasy)Ur Screen NONE DETECTED NONE DETECTED   Cocaine Metabolite,Ur [place] NONE DETECTED NONE DETECTED   Opiate, Ur Screen NONE DETECTED NONE DETECTED    Phencyclidine (PCP) Ur S NONE DETECTED NONE DETECTED   Cannabinoid 50 Ng, Ur [place] POSITIVE (A) NONE DETECTED   Barbiturates, Ur Screen NONE DETECTED NONE DETECTED   Benzodiazepine, Ur Scrn POSITIVE (A) NONE DETECTED   Methadone Scn, Ur NONE DETECTED NONE DETECTED    Comment: (NOTE) 417  Tricyclics, urine               Cutoff 1000 ng/mL 200  Amphetamines, urine             Cutoff 1000 ng/mL 300  MDMA (Ecstasy), urine           Cutoff 500 ng/mL 400  Cocaine Metabolite, urine       Cutoff 300 ng/mL 500  Opiate, urine                   Cutoff 300 ng/mL 600  Phencyclidine (PCP), urine      Cutoff 25 ng/mL 700  Cannabinoid, urine              Cutoff 50 ng/mL 800  Barbiturates, urine             Cutoff 200 ng/mL 900  Benzodiazepine, urine           Cutoff 200 ng/mL 1000 Methadone, urine                Cutoff 300 ng/mL 1100 1200 The urine drug screen provides only a preliminary, unconfirmed 1300 analytical test result and should not be used for non-medical 1400 purposes. Clinical consideration and professional judgment should 1500 be applied to any positive drug screen result due to possible 1600 interfering substances. A more specific alternate chemical method 1700 must be used in order to obtain a confirmed analytical result.  1800 Gas chromato graphy / mass spectrometry (GC/MS) is the preferred 1900 confirmatory method.     Current Facility-Administered Medications  Medication Dose Route Frequency Provider Last Rate Last Dose  . 0.9 %  sodium chloride infusion   Intravenous Continuous Harrie Foreman, MD 100 mL/hr at 06/06/16 1617    . acetaminophen (TYLENOL) tablet 650 mg  650 mg Oral Q6H PRN Harrie Foreman, MD       Or  . acetaminophen (TYLENOL) suppository 650 mg  650 mg Rectal Q6H PRN Harrie Foreman, MD      . albuterol (PROVENTIL) (2.5 MG/3ML) 0.083% nebulizer solution 2.5 mg  2.5 mg Nebulization Q6H PRN Harrie Foreman, MD      . ALPRAZolam Duanne Moron) tablet 0.5 mg  0.5 mg  Oral Q6H PRN Harrie Foreman, MD      . ALPRAZolam Duanne Moron) tablet 0.5 mg  0.5 mg Oral BID Gonzella Lex, MD      . docusate sodium (COLACE) capsule 100 mg  100 mg Oral BID Harrie Foreman, MD   100 mg at 06/06/16 0844  . enoxaparin (LOVENOX) injection 40 mg  40 mg Subcutaneous Q24H Harrie Foreman, MD      .  FLUoxetine (PROZAC) capsule 40 mg  40 mg Oral Daily Harrie Foreman, MD   40 mg at 06/06/16 0843  . ibuprofen (ADVIL,MOTRIN) tablet 600 mg  600 mg Oral Q8H PRN Harrie Foreman, MD      . labetalol (NORMODYNE,TRANDATE) injection 5-10 mg  5-10 mg Intravenous Q2H PRN Harrie Foreman, MD      . levothyroxine (SYNTHROID, LEVOTHROID) tablet 75 mcg  75 mcg Oral QAC breakfast Harrie Foreman, MD   75 mcg at 06/06/16 0844  . ondansetron (ZOFRAN) tablet 4 mg  4 mg Oral Q6H PRN Harrie Foreman, MD       Or  . ondansetron [facility]) injection 4 mg  4 mg Intravenous Q6H PRN Harrie Foreman, MD      . QUEtiapine (SEROQUEL) tablet 200 mg  200 mg Oral QHS Gonzella Lex, MD      . tiZANidine (ZANAFLEX) tablet 2 mg  2 mg Oral Q8H PRN Harrie Foreman, MD        Musculoskeletal: Strength & Muscle Tone: decreased Gait & Station: unable to stand Patient leans: Backward  Psychiatric Specialty Exam: Physical Exam  Nursing note and vitals reviewed. Constitutional: She appears well-developed and well-nourished.  HENT:  Head: Normocephalic and atraumatic.  Eyes: Conjunctivae are normal. Pupils are equal, round, and reactive to light.  Neck: Normal range of motion.  Cardiovascular: Regular rhythm and normal heart sounds.   Respiratory: Effort normal. No respiratory distress.  GI: Soft.  Musculoskeletal: Normal range of motion.  Neurological: She is alert.  Skin: Skin is warm and dry.  Psychiatric: Judgment normal. Her affect is blunt. Her speech is delayed and tangential. She is slowed. Thought content is not paranoid. She expresses no homicidal and no suicidal ideation. She exhibits  abnormal recent memory and abnormal remote memory. She is inattentive.    Review of Systems  Constitutional: Negative.   HENT: Negative.   Eyes: Negative.   Respiratory: Negative.   Cardiovascular: Negative.   Gastrointestinal: Negative.   Musculoskeletal: Negative.   Skin: Negative.   Neurological: Negative.   Psychiatric/Behavioral: Positive for depression, memory loss and substance abuse. Negative for hallucinations and suicidal ideas. The patient is nervous/anxious. The patient does not have insomnia.     Blood pressure (!) 147/84, pulse 91, temperature 99 F (37.2 C), temperature source Oral, resp. rate 20, height _0  (1.651 m), weight 51.9 kg (114 lb 8 oz), SpO2 96 %.Body mass index is 19.05 kg/m.  General Appearance: Disheveled  Eye Contact:  Fair  Speech:  Slow and Slurred  Volume:  Decreased  Mood:  Euthymic  Affect:  Blunt  Thought Process:  Disorganized  Orientation:  Other:  At first she told me she was at a community college. She then corrected herself that she was at the hospital. She still did not know the correct month.  Thought Content:  Illogical and Tangential  Suicidal Thoughts:  No  Homicidal Thoughts:  No  Memory:  Immediate;   Fair Recent;   Poor Remote;   Fair  Judgement:  Impaired  Insight:  Shallow  Psychomotor Activity:  Decreased  Concentration:  Concentration: Poor  Recall:  Poor  Fund of Knowledge:  Fair  Language:  Good  Akathisia:  No  Handed:  Right  AIMS (if indicated):     Assets:  Desire for Improvement Financial Resources/Insurance Housing Social Support  ADL's:  Impaired  Cognition:  Impaired,  Mild  Sleep:  Treatment Plan Summary: Daily contact with patient to assess and evaluate symptoms and progress in treatment, Medication management and Plan This is a 60 year old woman with a history of supposedly bipolar disorder although again the details are pretty unclear. She came into the hospital delirious. On interview  today she seems to of improved quite a bit although she still gets confused easily, her short-term memory is poor is uncertain as to her medicines. I suspect that a major part of what brought her into the hospital did have to do with her medication. I am still unclear whether she has actually been prescribed Xanax. There is no evidence of it from the controlled substance database but she did have a benzodiazepine positive drug screen. Regardless, in my opinion it would be inappropriate for somebody like this to be on chronic Xanax especially at the dose she is quoting. There is no indication for it. Therefore I'm cutting her Xanax dose down in half to a half milligram twice a day for now. It does not look like lithium has been a long-term necessary medication for her and is certainly not appropriate for somebody who has any tendency to misuse or be confused about medicine. I am discontinuing the lithium. The Seroquel 400 mg is a relatively high dose and I'm cutting that down to 200 mg. Patient does seem to be improving so far. Does not appear to need a sitter. Probably would not need any psychiatric hospitalization. I will continue to follow-up as needed.  Disposition: No evidence of imminent risk to self or others at present.   Patient does not meet criteria for psychiatric inpatient admission. Supportive therapy provided about ongoing stressors.  Alethia Berthold, MD 06/06/2016 4:40 PM

## 2016-06-06 NOTE — ED Notes (Signed)
SOC computer to bedside for consult

## 2016-06-06 NOTE — ED Notes (Signed)
[facility] Waiting on admitting physician to speak with patient.

## 2016-06-06 NOTE — Progress Notes (Signed)
[facility] [place] at [facility]   PATIENT NAME: Doris Jenkins    MRN#:  [id number]  DATE OF BIRTH:  1956/08/01  SUBJECTIVE:  Hospital Day: 0 days Doris Jenkins is a 60 y.o. female presenting with Tremor, confusion.   Overnight events: No acute overnight events Interval Events: Mental status improved, not yet at baseline, patient's son at bedside  REVIEW OF SYSTEMS:  CONSTITUTIONAL: No fever, fatigue or weakness.  EYES: No blurred or double vision.  EARS, NOSE, AND THROAT: No tinnitus or ear pain.  RESPIRATORY: No cough, shortness of breath, wheezing or hemoptysis.  CARDIOVASCULAR: No chest pain, orthopnea, edema.  GASTROINTESTINAL: No nausea, vomiting, diarrhea or abdominal pain.  GENITOURINARY: No dysuria, hematuria.  ENDOCRINE: No polyuria, nocturia,  HEMATOLOGY: No anemia, easy bruising or bleeding SKIN: No rash or lesion. MUSCULOSKELETAL: No joint pain or arthritis.   NEUROLOGIC: No tingling, numbness, weakness. Positive tremor PSYCHIATRY: No anxiety or depression.   DRUG ALLERGIES:   Allergies  Allergen Reactions  . Sulfa Antibiotics Itching    VITALS:  Blood pressure (!) 147/83, pulse 93, temperature 98.3 F (36.8 C), temperature source Oral, resp. rate 18, height 5\' 5"  (1.651 m), weight 51.9 kg (114 lb 8 oz), SpO2 97 %.  PHYSICAL EXAMINATION:  VITAL SIGNS: Vitals:   06/06/16 0427 06/06/16 0838  BP: (!) 142/87 (!) 147/83  Pulse: 96 93  Resp: 16 18  Temp: 98.6 F (37 C) 98.3 F (36.8 C)   GENERAL:59 y.o.female currently in no acute distress.  HEAD: Normocephalic, atraumatic.  EYES: Pupils equal, round, reactive to light. Extraocular muscles intact. No scleral icterus.  MOUTH: Moist mucosal membrane. Dentition intact. No abscess noted.  EAR, NOSE, THROAT: Clear without exudates. No external lesions.  NECK: Supple. No thyromegaly. No nodules. No JVD.  PULMONARY: Clear to ascultation, without wheeze rails or rhonci. No use of  accessory muscles, Good respiratory effort. good air entry bilaterally CHEST: Nontender to palpation.  CARDIOVASCULAR: S1 and S2. Regular rate and rhythm. No murmurs, rubs, or gallops. No edema. Pedal pulses 2+ bilaterally.  GASTROINTESTINAL: Soft, nontender, nondistended. No masses. Positive bowel sounds. No hepatosplenomegaly.  MUSCULOSKELETAL: No swelling, clubbing, or edema. Range of motion full in all extremities.  NEUROLOGIC: Cranial nerves II through XII are intact. No gross focal neurological deficits. Sensation intact. Reflexes intact.  SKIN: No ulceration, lesions, rashes, or cyanosis. Skin warm and dry. Turgor intact.  PSYCHIATRIC: Mood, affect flat The patient is awake, alert and oriented x 3. Mood but does get confused with some questions Insight, judgment poor at this time.      LABORATORY PANEL:   CBC  Recent Labs Lab 06/05/16 1833  WBC 13.7*  HGB 14.9  HCT 45.7  PLT 354   ------------------------------------------------------------------------------------------------------------------  Chemistries   Recent Labs Lab 06/05/16 1833  NA 142  K 3.7  CL 113*  CO2 19*  GLUCOSE 144*  BUN 29*  CREATININE 0.90  CALCIUM 9.5  AST 16  ALT 10*  ALKPHOS 109  BILITOT 1.2   ------------------------------------------------------------------------------------------------------------------  Cardiac Enzymes No results for input(s): TROPONINI in the last 168 hours. ------------------------------------------------------------------------------------------------------------------  RADIOLOGY:  Dg Chest 1 View  Result Date: 06/05/2016 CLINICAL DATA:  Altered mental status and weakness today. EXAM: CHEST 1 VIEW COMPARISON:  Chest CT 10/13/2012.  Chest 08/05/2010 FINDINGS: Emphysematous changes in the lungs with scattered fibrosis throughout. Normal heart size and pulmonary vascularity. No focal airspace disease or consolidation in the lungs. No blunting of costophrenic  angles. No pneumothorax. Mediastinal  contours appear intact. Calcification of the aorta. IMPRESSION: Diffuse emphysematous change and fibrosis in the lungs. No evidence of active consolidation. Electronically Signed   By: Burman NievesWilliam  Stevens M.D.   On: 06/05/2016 21:49   Ct Head Wo Contrast  Result Date: 06/05/2016 CLINICAL DATA:  Confusion/altered mental status EXAM: CT HEAD WITHOUT CONTRAST TECHNIQUE: Contiguous axial images were obtained from the base of the skull through the vertex without intravenous contrast. COMPARISON:  January 09, 2011 FINDINGS: Brain: The ventricles are normal in size and configuration. There is no intracranial mass, hemorrhage, extra-axial fluid collection, or midline shift. Gray-white compartments appear normal. No acute infarct evident. Vascular: There is no hyperdense vessel. There are foci of calcification in each carotid siphon. Skull: The bony calvarium appears intact. Sinuses/Orbits: Visualized paranasal sinuses are clear. There is rightward deviation of the posterior nasal septum. Orbits appear symmetric bilaterally. Other: Visualized mastoid air cells are clear. IMPRESSION: No intracranial mass, hemorrhage, or focal gray -white compartment lesion. Mild arterial vascular calcification. Rightward deviation nasal septum. Electronically Signed   By: Bretta BangWilliam  Woodruff III M.D.   On: 06/05/2016 22:00    EKG:   Orders placed or performed during the hospital encounter of 06/05/16  . ED EKG  . ED EKG  . EKG 12-Lead  . EKG 12-Lead    ASSESSMENT AND PLAN:   Doris Jenkins is a 60 y.o. female presenting with Tremor/confusion . Admitted 06/05/2016 : Day #: 0 days  1. Encephalopathy, unspecified: Likely multifactorial check urinalysis to rule out infection, check free T4 for thyroid function, patient has been out of her xanax for a few days-according to her son her prescriptions as 120 pills for the month, but he found that empty yesterday. She has been with her Xanax for about 2  days, her symptoms may be in regards to withdrawal 2. Hypothyroidism unspecified: Check thyroid function tests as above continue Synthroid 3. Bipolar disorder not otherwise specified: Continue lithium   All the records are reviewed and case discussed with Care Management/Social Workerr. Management plans discussed with the patient, family and they are in agreement.  CODE STATUS: full TOTAL TIME TAKING CARE OF THIS PATIENT: 28 minutes.   POSSIBLE D/C IN 1-2DAYS, DEPENDING ON CLINICAL CONDITION.   Hower,  Mardi MainlandDavid K M.D on 06/06/2016 at 11:59 AM  Between 7am to 6pm - Pager - [phone]  After 6pm: House Pager: - [phone]  [facility]  Office  [phone]  CC: Primary care physician; [facility]

## 2016-06-07 DIAGNOSIS — G9340 Encephalopathy, unspecified: Secondary | ICD-10-CM | POA: Diagnosis not present

## 2016-06-07 LAB — HEMOGLOBIN A1C
Hgb A1c MFr Bld: 5 % (ref 4.8–5.6)
Mean Plasma Glucose: 97 mg/dL

## 2016-06-07 MED ORDER — ALPRAZOLAM 0.5 MG PO TABS: 0.5000 mg | ORAL_TABLET | Freq: Two times a day (BID) | ORAL | 0 refills | Status: DC

## 2016-06-07 MED ORDER — QUETIAPINE FUMARATE 200 MG PO TABS: 200.0000 mg | ORAL_TABLET | Freq: Every day | ORAL | 0 refills | Status: DC

## 2016-06-07 NOTE — Care Management Note (Signed)
Case Management Note  Patient Details  Name: Doris Jenkins MRN: [id number] Date of Birth: Jul 24, 1956  Subjective/Objective:        Discussed discharge planning with [name] who chose [facility] to be her provider for HH-PT. [name] will be staying with son [name] after hospital discharge but does not know his address. On referral to [facility] this writer requested that son [name] be contacted at [phone] to confirm his address and that Mom would be staying with him. This Clinical research associatewriter has been unable to reach son [name] at this number this morning but will keep trying. His phone will not accept voice messages. A request for a RW to be delivered to [name] today in her hospital room prior to discharge today was also faxed to [facility].             Action/Plan:   Expected Discharge Date:                  Expected Discharge Plan:     In-House Referral:     Discharge planning Services     Post Acute Care Choice:    Choice offered to:     DME Arranged:    DME Agency:     HH Arranged:    HH Agency:     Status of Service:     If discussed at [employer] Length of Stay Meetings, dates discussed:    Additional Comments:  Jaleigha Deane A, RN 06/07/2016, 9:16 AM

## 2016-06-07 NOTE — Progress Notes (Signed)
Pt being discharged today, instructions reviewed with patient and the patient's son, both verified understanding. 2 paper prescriptions given to patient. IV removed X1. Pt belongings packed and returned to patient. She was rolled out in a wheelchair by staff.

## 2016-06-07 NOTE — Care Management Note (Signed)
Case Management Note  Patient Details  Name: Doris Ripperamela J Jenkins MRN: [id number] Date of Birth: Mar 22, 1956  Subjective/Objective:        Son Colletta MarylandJohn Broome has arrived and reports that [name] has 2 walkers at home and does not need another walker. This Clinical research associatewriter [facility] and cancelled the order for a RW.             Action/Plan:   Expected Discharge Date:                  Expected Discharge Plan:     In-House Referral:     Discharge planning Services     Post Acute Care Choice:    Choice offered to:     DME Arranged:    DME Agency:     HH Arranged:    HH Agency:     Status of Service:     If discussed at [employer] Length of Stay Meetings, dates discussed:    Additional Comments:  Kiptyn Rafuse A, RN 06/07/2016, 11:22 AM

## 2016-06-07 NOTE — Discharge Summary (Signed)
[facility] [place] at [facility]   PATIENT NAME: Doris Jenkins    MR#:  [id number]  DATE OF BIRTH:  July 16, 1956  DATE OF ADMISSION:  06/05/2016 ADMITTING PHYSICIAN: Arnaldo Natal, MD  DATE OF DISCHARGE: 06/07/16  PRIMARY CARE PHYSICIAN: [facility]    ADMISSION DIAGNOSIS:  Delirium [R41.0] Tremor [R25.1]  DISCHARGE DIAGNOSIS:     Encephalopathy   Benzodiazapine withdrawal      SECONDARY DIAGNOSIS:  History reviewed. No pertinent past medical history.  HOSPITAL COURSE:  Doris Jenkins  is a 60 y.o. female admitted 06/05/2016 with chief complaint confusion and tremor. Please see H&P performed by Arnaldo Natal, MD for further information. Patient presented with the above findings, in relation to running out of xanax for 3 days. No further identifiable source was found for altered mental status. Psychiatry consulted and assisted in adjustment of medications.   PT eval recommended home pt  DISCHARGE CONDITIONS:   stable  CONSULTS OBTAINED:  Treatment Team:  Wyatt Haste, MD Audery Amel, MD  DRUG ALLERGIES:   Allergies  Allergen Reactions  . Sulfa Antibiotics Itching    DISCHARGE MEDICATIONS:   Current Discharge Medication List    CONTINUE these medications which have CHANGED   Details  ALPRAZolam (XANAX) 0.5 MG tablet Take 1 tablet (0.5 mg total) by mouth 2 (two) times daily. Qty: 30 tablet, Refills: 0    QUEtiapine (SEROQUEL) 200 MG tablet Take 1 tablet (200 mg total) by mouth at bedtime. Qty: 30 tablet, Refills: 0      CONTINUE these medications which have NOT CHANGED   Details  albuterol (PROVENTIL) (2.5 MG/3ML) 0.083% nebulizer solution Take 2.5 mg by nebulization every 6 (six) hours as needed for wheezing or shortness of breath.    FLUoxetine (PROZAC) 40 MG capsule Take 40 mg by mouth daily.    ibuprofen (ADVIL,MOTRIN) 200 MG tablet Take 600 mg by mouth every 6 (six) hours as needed for headache or mild pain.      levothyroxine (SYNTHROID, LEVOTHROID) 75 MCG tablet Take 75 mcg by mouth daily before breakfast.    tiZANidine (ZANAFLEX) 2 MG tablet Take 2 mg by mouth every 8 (eight) hours as needed for muscle spasms.      STOP taking these medications     lithium 300 MG tablet          DISCHARGE INSTRUCTIONS:    DIET:  Regular diet  DISCHARGE CONDITION:  Stable  ACTIVITY:  Activity as tolerated  OXYGEN:  Home Oxygen: No.   Oxygen Delivery: room air  DISCHARGE LOCATION:  home   If you experience worsening of your admission symptoms, develop shortness of breath, life threatening emergency, suicidal or homicidal thoughts you must seek medical attention immediately by calling 911 or calling your MD immediately  if symptoms less severe.  You Must read complete instructions/literature along with all the possible adverse reactions/side effects for all the Medicines you take and that have been prescribed to you. Take any new Medicines after you have completely understood and accpet all the possible adverse reactions/side effects.   Please note  You were cared for by a hospitalist during your hospital stay. If you have any questions about your discharge medications or the care you received while you were in the hospital after you are discharged, you can call the unit and asked to speak with the hospitalist on call if the hospitalist that took care of you is not available. Once you are discharged,  your primary care physician will handle any further medical issues. Please note that NO REFILLS for any discharge medications will be authorized once you are discharged, as it is imperative that you return to your primary care physician (or establish a relationship with a primary care physician if you do not have one) for your aftercare needs so that they can reassess your need for medications and monitor your lab values.    On the day of Discharge:   VITAL SIGNS:  Blood pressure (!) 149/81,  pulse 94, temperature 97.9 F (36.6 C), temperature source Oral, resp. rate (!) 24, height 5\' 5"  (1.651 m), weight 53.8 kg (118 lb 8 oz), SpO2 96 %.  I/O:   Intake/Output Summary (Last 24 hours) at 06/07/16 0905 Last data filed at 06/07/16 [id number]  Gross per 24 hour  Intake          2366.67 ml  Output              300 ml  Net          2066.67 ml    PHYSICAL EXAMINATION:  GENERAL:  60 y.o.-year-old patient lying in the bed with no acute distress.  EYES: Pupils equal, round, reactive to light and accommodation. No scleral icterus. Extraocular muscles intact.  HEENT: Head atraumatic, normocephalic. Oropharynx and nasopharynx clear.  NECK:  Supple, no jugular venous distention. No thyroid enlargement, no tenderness.  LUNGS: Normal breath sounds bilaterally, no wheezing, rales,rhonchi or crepitation. No use of accessory muscles of respiration.  CARDIOVASCULAR: S1, S2 normal. No murmurs, rubs, or gallops.  ABDOMEN: Soft, non-tender, non-distended. Bowel sounds present. No organomegaly or mass.  EXTREMITIES: No pedal edema, cyanosis, or clubbing.  NEUROLOGIC: Cranial nerves II through XII are intact. Muscle strength 5/5 in all extremities. Sensation intact. Gait not checked.  PSYCHIATRIC: The patient is alert and oriented x 3.  SKIN: No obvious rash, lesion, or ulcer.   DATA REVIEW:   CBC  Recent Labs Lab 06/05/16 1833  WBC 13.7*  HGB 14.9  HCT 45.7  PLT 354    Chemistries   Recent Labs Lab 06/05/16 1833  NA 142  K 3.7  CL 113*  CO2 19*  GLUCOSE 144*  BUN 29*  CREATININE 0.90  CALCIUM 9.5  AST 16  ALT 10*  ALKPHOS 109  BILITOT 1.2    Cardiac Enzymes No results for input(s): TROPONINI in the last 168 hours.  Microbiology Results  Results for orders placed or performed during the hospital encounter of 08/03/15  Urine culture     Status: None   Collection Time: 08/03/15  9:15 PM  Result Value Ref Range Status   Specimen Description URINE, RANDOM  Final   Special  Requests NONE  Final   Culture MULTIPLE SPECIES PRESENT, SUGGEST RECOLLECTION  Final   Report Status 08/05/2015 FINAL  Final    RADIOLOGY:  Dg Chest 1 View  Result Date: 06/05/2016 CLINICAL DATA:  Altered mental status and weakness today. EXAM: CHEST 1 VIEW COMPARISON:  Chest CT 10/13/2012.  Chest 08/05/2010 FINDINGS: Emphysematous changes in the lungs with scattered fibrosis throughout. Normal heart size and pulmonary vascularity. No focal airspace disease or consolidation in the lungs. No blunting of costophrenic angles. No pneumothorax. Mediastinal contours appear intact. Calcification of the aorta. IMPRESSION: Diffuse emphysematous change and fibrosis in the lungs. No evidence of active consolidation. Electronically Signed   By: Burman NievesWilliam  Stevens M.D.   On: 06/05/2016 21:49   Ct Head Wo Contrast  Result Date: 06/05/2016 CLINICAL  DATA:  Confusion/altered mental status EXAM: CT HEAD WITHOUT CONTRAST TECHNIQUE: Contiguous axial images were obtained from the base of the skull through the vertex without intravenous contrast. COMPARISON:  January 09, 2011 FINDINGS: Brain: The ventricles are normal in size and configuration. There is no intracranial mass, hemorrhage, extra-axial fluid collection, or midline shift. Gray-white compartments appear normal. No acute infarct evident. Vascular: There is no hyperdense vessel. There are foci of calcification in each carotid siphon. Skull: The bony calvarium appears intact. Sinuses/Orbits: Visualized paranasal sinuses are clear. There is rightward deviation of the posterior nasal septum. Orbits appear symmetric bilaterally. Other: Visualized mastoid air cells are clear. IMPRESSION: No intracranial mass, hemorrhage, or focal gray -white compartment lesion. Mild arterial vascular calcification. Rightward deviation nasal septum. Electronically Signed   By: Bretta Bang III M.D.   On: 06/05/2016 22:00     Management plans discussed with the patient, family and they  are in agreement.  CODE STATUS:     Code Status Orders        Start     Ordered   06/06/16 0438  Full code  Continuous     06/06/16 0437    Code Status History    Date Active Date Inactive Code Status Order ID Comments User Context   This patient has a current code status but no historical code status.    Advance Directive Documentation   Flowsheet Row Most Recent Value  Type of Advance Directive  Healthcare Power of Attorney, Living will  Pre-existing out of facility DNR order (yellow form or pink MOST form)  No data  "MOST" Form in Place?  No data      TOTAL TIME TAKING CARE OF THIS PATIENT: 33 minutes.    Doris Jenkins,  Mardi Mainland.D on 06/07/2016 at 9:05 AM  Between 7am to 6pm - Pager - [phone]  After 6pm go to [email or web address] - Social research officer, government  [employer] [facility]  Office  [phone]  CC: Primary care physician; [facility]

## 2016-06-07 NOTE — Care Management Obs Status (Signed)
MEDICARE OBSERVATION STATUS NOTIFICATION   Patient Details  Name: Alan Ripperamela J Tartt MRN: [id number] Date of Birth: October 15, 1955   Medicare Observation Status Notification Given:  Yes    Caren MacadamMichelle Jaequan Propes, RN 06/07/2016, 10:10 AM

## 2016-06-08 NOTE — Progress Notes (Signed)
   06/06/16 1400  PT Visit Information  Last PT Received On 06/06/16  Assistance Needed +1  History of Present Illness 60 yo female with onset of encephalopathy and emphysema with lung fibrosis noted on imaging.  Concerns for medication withdrawal due to her bottle of meds being out.  PMHx: bipolar, tremors, hypothyroidism  Subjective Data  Subjective I am feeling weak  Patient Stated Goal To try to walk  Precautions  Precautions Fall  Precaution Comments pt is up in chair with alarm  Restrictions  Weight Bearing Restrictions No  Pain Assessment  Pain Assessment No/denies pain  Cognition  Arousal/Alertness Awake/alert  Behavior During Therapy Anxious  Overall Cognitive Status History of cognitive impairments - at baseline  Memory Decreased short-term memory;Decreased recall of precautions  Bed Mobility  Overal bed mobility Needs Assistance  Bed Mobility Supine to Sit  Supine to sit Min assist;Min guard  General bed mobility comments cued and assisted to bedside  Transfers  Overall transfer level Needs assistance  Equipment used 1 person hand held assist (IV pole)  Transfers Sit to/from Stand;Stand Pivot Transfers  Sit to Stand Min assist  Stand pivot transfers Min assist  General transfer comment cued hand placement and control of IV pole  Ambulation/Gait  Ambulation/Gait assistance Min guard;Min assist  Ambulation Distance (Feet) 80 Feet  Assistive device 1 person hand held assist (IV pole)  Gait Pattern/deviations Step-through pattern;Narrow base of support;Trunk flexed;Decreased stride length  Gait velocity decreased  Gait velocity interpretation Below normal speed for age/gender  Balance  Overall balance assessment Needs assistance  Sitting-balance support Feet supported  Sitting balance-Leahy Scale Fair  Postural control Posterior lean  Standing balance support Bilateral upper extremity supported  Standing balance-Leahy Scale Poor  General Comments  General  comments (skin integrity, edema, etc.) Pt is up for the first time to walk today and may look considerably more secure to walk with RW after this visit, nursing aware of her needs for assistance.  Exercises  Exercises Other exercises (R leg 4- to 4, LLE 4+)  PT - End of Session  Equipment Utilized During Treatment Gait belt  Activity Tolerance Patient tolerated treatment well;Patient limited by fatigue  Patient left in chair;with call bell/phone within reach;with chair alarm set;with family/visitor present  Nurse Communication Mobility status;Other (comment) (walker in room for further walks)  PT - Assessment/Plan  PT Frequency (ACUTE ONLY) Min 2X/week  Recommendations for Other Services Rehab consult  Follow Up Recommendations Home health PT;Supervision for mobility/OOB  PT equipment Rolling walker with 5" wheels  Acute Rehab PT Goals  PT Goal Formulation With patient/family  Time For Goal Achievement 06/20/16  Potential to Achieve Goals Good  PT Time Calculation  PT Start Time (ACUTE ONLY) 1249  PT Stop Time (ACUTE ONLY) 1314  PT Time Calculation (min) (ACUTE ONLY) 25 min  PT G-Codes **NOT FOR INPATIENT CLASS**  Functional Assessment Tool Used clinical judgment  Functional Limitation Mobility: Walking and moving around  Mobility: Walking and Moving Around Current Status (Z6109(G8978) CJ  Mobility: Walking and Moving Around Goal Status (U0454(G8979) CI  PT General Charges  $$ ACUTE PT VISIT 1 Procedure  PT Evaluation  $PT Eval Low Complexity 1 Procedure  PT Treatments  $Gait Training 8-22 mins  Samul Dadauth Kazuki Ingle, PT MS Acute Rehab Dept. Number: [facility] [phone] and [facility] [phone]

## 2016-12-15 ENCOUNTER — Other Ambulatory Visit: Payer: Self-pay | Admitting: Specialist

## 2016-12-15 DIAGNOSIS — R059 Cough, unspecified: Secondary | ICD-10-CM

## 2016-12-15 DIAGNOSIS — R05 Cough: Secondary | ICD-10-CM

## 2016-12-17 ENCOUNTER — Ambulatory Visit
Admission: RE | Admit: 2016-12-17 | Discharge: 2016-12-17 | Disposition: A | Discharge status: Home / Self Care | Payer: Medicare Other | Source: Ambulatory Visit | Attending: Specialist | Admitting: Specialist

## 2016-12-17 DIAGNOSIS — E890 Postprocedural hypothyroidism: Secondary | ICD-10-CM | POA: Diagnosis not present

## 2016-12-17 DIAGNOSIS — M47814 Spondylosis without myelopathy or radiculopathy, thoracic region: Secondary | ICD-10-CM | POA: Diagnosis not present

## 2016-12-17 DIAGNOSIS — J432 Centrilobular emphysema: Secondary | ICD-10-CM | POA: Diagnosis not present

## 2016-12-17 DIAGNOSIS — R05 Cough: Secondary | ICD-10-CM

## 2016-12-17 DIAGNOSIS — I700 Atherosclerosis of aorta: Secondary | ICD-10-CM | POA: Insufficient documentation

## 2016-12-17 DIAGNOSIS — R059 Cough, unspecified: Secondary | ICD-10-CM

## 2016-12-17 DIAGNOSIS — I2510 Atherosclerotic heart disease of native coronary artery without angina pectoris: Secondary | ICD-10-CM | POA: Insufficient documentation

## 2018-03-01 ENCOUNTER — Other Ambulatory Visit: Payer: Self-pay | Admitting: Nurse Practitioner

## 2018-03-01 DIAGNOSIS — N6459 Other signs and symptoms in breast: Secondary | ICD-10-CM

## 2018-03-01 DIAGNOSIS — Z1231 Encounter for screening mammogram for malignant neoplasm of breast: Secondary | ICD-10-CM

## 2018-03-02 ENCOUNTER — Other Ambulatory Visit: Payer: Self-pay | Admitting: Nurse Practitioner

## 2018-03-02 DIAGNOSIS — Z1231 Encounter for screening mammogram for malignant neoplasm of breast: Secondary | ICD-10-CM

## 2018-03-02 DIAGNOSIS — N6459 Other signs and symptoms in breast: Secondary | ICD-10-CM

## 2018-03-03 ENCOUNTER — Ambulatory Visit
Admission: RE | Admit: 2018-03-03 | Discharge: 2018-03-03 | Disposition: A | Discharge status: Home / Self Care | Payer: Medicare Other | Source: Ambulatory Visit | Attending: Nurse Practitioner | Admitting: Nurse Practitioner

## 2018-03-03 ENCOUNTER — Other Ambulatory Visit: Payer: Self-pay | Admitting: Nurse Practitioner

## 2018-03-03 DIAGNOSIS — Z1231 Encounter for screening mammogram for malignant neoplasm of breast: Secondary | ICD-10-CM

## 2018-03-03 DIAGNOSIS — N6459 Other signs and symptoms in breast: Secondary | ICD-10-CM

## 2018-03-09 ENCOUNTER — Other Ambulatory Visit: Payer: Self-pay | Admitting: Nurse Practitioner

## 2018-03-09 DIAGNOSIS — R928 Other abnormal and inconclusive findings on diagnostic imaging of breast: Secondary | ICD-10-CM

## 2018-03-09 DIAGNOSIS — N6320 Unspecified lump in the left breast, unspecified quadrant: Secondary | ICD-10-CM

## 2018-03-11 ENCOUNTER — Ambulatory Visit
Admission: RE | Admit: 2018-03-11 | Discharge: 2018-03-11 | Disposition: A | Discharge status: Home / Self Care | Payer: Medicare Other | Source: Ambulatory Visit | Attending: Nurse Practitioner | Admitting: Nurse Practitioner

## 2018-03-11 DIAGNOSIS — R928 Other abnormal and inconclusive findings on diagnostic imaging of breast: Secondary | ICD-10-CM

## 2018-03-11 DIAGNOSIS — N6320 Unspecified lump in the left breast, unspecified quadrant: Secondary | ICD-10-CM | POA: Insufficient documentation

## 2018-03-14 ENCOUNTER — Other Ambulatory Visit: Payer: Self-pay

## 2018-03-16 LAB — SURGICAL PATHOLOGY

## 2021-02-13 ENCOUNTER — Inpatient Hospital Stay
Admission: EM | Admit: 2021-02-13 | Discharge: 2021-02-16 | DRG: 190 | Disposition: A | Discharge status: Home / Self Care | Payer: Medicare Other | Attending: Internal Medicine | Admitting: Internal Medicine

## 2021-02-13 ENCOUNTER — Encounter: Payer: Self-pay | Admitting: Internal Medicine

## 2021-02-13 ENCOUNTER — Emergency Department: Payer: Medicare Other

## 2021-02-13 DIAGNOSIS — K219 Gastro-esophageal reflux disease without esophagitis: Secondary | ICD-10-CM | POA: Diagnosis present

## 2021-02-13 DIAGNOSIS — J439 Emphysema, unspecified: Secondary | ICD-10-CM | POA: Diagnosis not present

## 2021-02-13 DIAGNOSIS — E890 Postprocedural hypothyroidism: Secondary | ICD-10-CM | POA: Diagnosis present

## 2021-02-13 DIAGNOSIS — J441 Chronic obstructive pulmonary disease with (acute) exacerbation: Secondary | ICD-10-CM

## 2021-02-13 DIAGNOSIS — R0603 Acute respiratory distress: Secondary | ICD-10-CM

## 2021-02-13 DIAGNOSIS — M792 Neuralgia and neuritis, unspecified: Secondary | ICD-10-CM

## 2021-02-13 DIAGNOSIS — I10 Essential (primary) hypertension: Secondary | ICD-10-CM

## 2021-02-13 DIAGNOSIS — R0902 Hypoxemia: Secondary | ICD-10-CM

## 2021-02-13 DIAGNOSIS — Z20822 Contact with and (suspected) exposure to covid-19: Secondary | ICD-10-CM | POA: Diagnosis present

## 2021-02-13 DIAGNOSIS — M545 Low back pain, unspecified: Secondary | ICD-10-CM | POA: Diagnosis present

## 2021-02-13 DIAGNOSIS — E440 Moderate protein-calorie malnutrition: Secondary | ICD-10-CM | POA: Diagnosis present

## 2021-02-13 DIAGNOSIS — F319 Bipolar disorder, unspecified: Secondary | ICD-10-CM | POA: Diagnosis not present

## 2021-02-13 DIAGNOSIS — M549 Dorsalgia, unspecified: Secondary | ICD-10-CM | POA: Diagnosis present

## 2021-02-13 DIAGNOSIS — Z90710 Acquired absence of both cervix and uterus: Secondary | ICD-10-CM

## 2021-02-13 DIAGNOSIS — G8929 Other chronic pain: Secondary | ICD-10-CM | POA: Diagnosis present

## 2021-02-13 DIAGNOSIS — Z8249 Family history of ischemic heart disease and other diseases of the circulatory system: Secondary | ICD-10-CM

## 2021-02-13 DIAGNOSIS — F17210 Nicotine dependence, cigarettes, uncomplicated: Secondary | ICD-10-CM | POA: Diagnosis present

## 2021-02-13 DIAGNOSIS — J9601 Acute respiratory failure with hypoxia: Secondary | ICD-10-CM | POA: Diagnosis not present

## 2021-02-13 DIAGNOSIS — Z882 Allergy status to sulfonamides status: Secondary | ICD-10-CM

## 2021-02-13 DIAGNOSIS — E039 Hypothyroidism, unspecified: Secondary | ICD-10-CM | POA: Diagnosis present

## 2021-02-13 DIAGNOSIS — E032 Hypothyroidism due to medicaments and other exogenous substances: Secondary | ICD-10-CM

## 2021-02-13 DIAGNOSIS — Z79890 Hormone replacement therapy (postmenopausal): Secondary | ICD-10-CM

## 2021-02-13 DIAGNOSIS — Z79899 Other long term (current) drug therapy: Secondary | ICD-10-CM

## 2021-02-13 DIAGNOSIS — Z681 Body mass index (BMI) 19 or less, adult: Secondary | ICD-10-CM

## 2021-02-13 DIAGNOSIS — E876 Hypokalemia: Secondary | ICD-10-CM

## 2021-02-13 LAB — BLOOD GAS, VENOUS
Acid-Base Excess: 1.6 mmol/L (ref 0.0–2.0)
Bicarbonate: 27.2 mmol/L (ref 20.0–28.0)
O2 Saturation: 89.9 %
Patient temperature: 37
pCO2, Ven: 45 mmHg (ref 44.0–60.0)
pH, Ven: 7.39 (ref 7.250–7.430)
pO2, Ven: 59 mmHg — ABNORMAL HIGH (ref 32.0–45.0)

## 2021-02-13 LAB — BASIC METABOLIC PANEL
Anion gap: 13 (ref 5–15)
BUN: 9 mg/dL (ref 8–23)
CO2: 23 mmol/L (ref 22–32)
Calcium: 8.6 mg/dL — ABNORMAL LOW (ref 8.9–10.3)
Chloride: 98 mmol/L (ref 98–111)
Creatinine, Ser: 0.63 mg/dL (ref 0.44–1.00)
GFR, Estimated: >60 mL/min (ref >60)
Glucose, Bld: 146 mg/dL — ABNORMAL HIGH (ref 70–99)
Potassium: 3.1 mmol/L — ABNORMAL LOW (ref 3.5–5.1)
Sodium: 134 mmol/L — ABNORMAL LOW (ref 135–145)

## 2021-02-13 LAB — CBC WITH DIFFERENTIAL/PLATELET
Abs Immature Granulocytes: 0.07 10*3/uL (ref 0.00–0.07)
Basophils Absolute: 0 10*3/uL (ref 0.0–0.1)
Basophils Relative: 0 %
Eosinophils Absolute: 0 10*3/uL (ref 0.0–0.5)
Eosinophils Relative: 0 %
HCT: 43.7 % (ref 36.0–46.0)
Hemoglobin: 15 g/dL (ref 12.0–15.0)
Immature Granulocytes: 1 %
Lymphocytes Relative: 6 %
Lymphs Abs: 0.9 10*3/uL (ref 0.7–4.0)
MCH: 32.7 pg (ref 26.0–34.0)
MCHC: 34.3 g/dL (ref 30.0–36.0)
MCV: 95.2 fL (ref 80.0–100.0)
Monocytes Absolute: 1.5 10*3/uL — ABNORMAL HIGH (ref 0.1–1.0)
Monocytes Relative: 10 %
Neutro Abs: 11.9 10*3/uL — ABNORMAL HIGH (ref 1.7–7.7)
Neutrophils Relative %: 83 %
Platelets: 253 10*3/uL (ref 150–400)
RBC: 4.59 MIL/uL (ref 3.87–5.11)
RDW: 14.1 % (ref 11.5–15.5)
WBC: 14.4 10*3/uL — ABNORMAL HIGH (ref 4.0–10.5)
nRBC: 0 % (ref 0.0–0.2)

## 2021-02-13 LAB — RESP PANEL BY RT-PCR (FLU A&B, COVID) ARPGX2
Influenza A by PCR: NEGATIVE
Influenza B by PCR: NEGATIVE
SARS Coronavirus 2 by RT PCR: NEGATIVE

## 2021-02-13 LAB — TROPONIN I (HIGH SENSITIVITY): Troponin I (High Sensitivity): 8 ng/L (ref <18)

## 2021-02-13 MED ORDER — GABAPENTIN 300 MG PO CAPS
300.0000 mg | ORAL_CAPSULE | Freq: Three times a day (TID) | ORAL | Status: DC
  Administered 2021-02-14 (×2): 300 mg via ORAL
  Filled 2021-02-13 (×2): qty 1

## 2021-02-13 MED ORDER — IPRATROPIUM-ALBUTEROL 0.5-2.5 (3) MG/3ML IN SOLN
3.0000 mL | Freq: Four times a day (QID) | RESPIRATORY_TRACT | Status: DC
  Administered 2021-02-14 – 2021-02-16 (×8): 3 mL via RESPIRATORY_TRACT
  Filled 2021-02-13 (×10): qty 3

## 2021-02-13 MED ORDER — IPRATROPIUM-ALBUTEROL 0.5-2.5 (3) MG/3ML IN SOLN
3.0000 mL | Freq: Once | RESPIRATORY_TRACT | Status: AC
  Administered 2021-02-13: 3 mL via RESPIRATORY_TRACT
  Filled 2021-02-13: qty 3

## 2021-02-13 MED ORDER — ENOXAPARIN SODIUM 40 MG/0.4ML IJ SOSY
40.0000 mg | PREFILLED_SYRINGE | INTRAMUSCULAR | Status: DC
  Administered 2021-02-14 – 2021-02-15 (×3): 40 mg via SUBCUTANEOUS
  Filled 2021-02-13 (×3): qty 0.4

## 2021-02-13 MED ORDER — FLUTICASONE FUROATE-VILANTEROL 200-25 MCG/INH IN AEPB
1.0000 | INHALATION_SPRAY | Freq: Every day | RESPIRATORY_TRACT | Status: DC
  Administered 2021-02-14 – 2021-02-16 (×3): 1 via RESPIRATORY_TRACT
  Filled 2021-02-13 (×3): qty 28

## 2021-02-13 MED ORDER — TIZANIDINE HCL 2 MG PO TABS
2.0000 mg | ORAL_TABLET | Freq: Three times a day (TID) | ORAL | Status: DC | PRN
  Administered 2021-02-14: 2 mg via ORAL
  Filled 2021-02-13 (×3): qty 1

## 2021-02-13 MED ORDER — AZITHROMYCIN 500 MG PO TABS
500.0000 mg | ORAL_TABLET | Freq: Every day | ORAL | Status: DC
  Administered 2021-02-14 – 2021-02-16 (×3): 500 mg via ORAL
  Filled 2021-02-13 (×3): qty 1

## 2021-02-13 MED ORDER — ALPRAZOLAM 0.5 MG PO TABS
0.5000 mg | ORAL_TABLET | Freq: Two times a day (BID) | ORAL | Status: DC
  Administered 2021-02-14: 0.5 mg via ORAL
  Filled 2021-02-13: qty 1

## 2021-02-13 MED ORDER — PREDNISONE 20 MG PO TABS
40.0000 mg | ORAL_TABLET | Freq: Every day | ORAL | Status: DC
  Administered 2021-02-14: 40 mg via ORAL
  Filled 2021-02-13: qty 2

## 2021-02-13 MED ORDER — SODIUM CHLORIDE 0.9 % IV SOLN
500.0000 mg | Freq: Once | INTRAVENOUS | Status: AC
  Administered 2021-02-13: 500 mg via INTRAVENOUS
  Filled 2021-02-13: qty 500

## 2021-02-13 MED ORDER — ACETAMINOPHEN 325 MG PO TABS: 650.0000 mg | ORAL_TABLET | Freq: Four times a day (QID) | ORAL | Status: DC | PRN

## 2021-02-13 MED ORDER — SODIUM CHLORIDE 0.9% FLUSH
3.0000 mL | Freq: Two times a day (BID) | INTRAVENOUS | Status: DC
  Administered 2021-02-14 – 2021-02-15 (×3): 3 mL via INTRAVENOUS

## 2021-02-13 MED ORDER — QUETIAPINE FUMARATE 25 MG PO TABS
200.0000 mg | ORAL_TABLET | Freq: Every day | ORAL | Status: DC
  Administered 2021-02-14 – 2021-02-15 (×2): 200 mg via ORAL
  Filled 2021-02-13 (×2): qty 8

## 2021-02-13 MED ORDER — ACETAMINOPHEN 650 MG RE SUPP: 650.0000 mg | Freq: Four times a day (QID) | RECTAL | Status: DC | PRN

## 2021-02-13 MED ORDER — POTASSIUM CHLORIDE 10 MEQ/100ML IV SOLN
10.0000 meq | INTRAVENOUS | Status: AC
  Administered 2021-02-14 (×6): 10 meq via INTRAVENOUS
  Filled 2021-02-13 (×6): qty 100

## 2021-02-13 MED ORDER — FLUOXETINE HCL 20 MG PO CAPS
40.0000 mg | ORAL_CAPSULE | Freq: Every day | ORAL | Status: DC
  Administered 2021-02-14 – 2021-02-16 (×3): 40 mg via ORAL
  Filled 2021-02-13 (×3): qty 2

## 2021-02-13 MED ORDER — SODIUM CHLORIDE 0.9 % IV SOLN: INTRAVENOUS | Status: DC

## 2021-02-13 MED ORDER — MAGNESIUM SULFATE 2 GM/50ML IV SOLN
2.0000 g | Freq: Once | INTRAVENOUS | Status: AC
  Administered 2021-02-13: 2 g via INTRAVENOUS
  Filled 2021-02-13: qty 50

## 2021-02-13 MED ORDER — ALBUTEROL SULFATE (2.5 MG/3ML) 0.083% IN NEBU
2.5000 mg | INHALATION_SOLUTION | RESPIRATORY_TRACT | Status: DC | PRN
  Administered 2021-02-15: 2.5 mg via RESPIRATORY_TRACT
  Filled 2021-02-13: qty 3

## 2021-02-13 MED ORDER — LEVOTHYROXINE SODIUM 50 MCG PO TABS
75.0000 ug | ORAL_TABLET | Freq: Every day | ORAL | Status: DC
  Administered 2021-02-14 – 2021-02-16 (×3): 75 ug via ORAL
  Filled 2021-02-13: qty 2
  Filled 2021-02-13 (×2): qty 1

## 2021-02-13 MED ORDER — POLYETHYLENE GLYCOL 3350 17 G PO PACK: 17.0000 g | PACK | Freq: Every day | ORAL | Status: DC | PRN

## 2021-02-13 MED ORDER — PANTOPRAZOLE SODIUM 40 MG PO TBEC
40.0000 mg | DELAYED_RELEASE_TABLET | Freq: Every day | ORAL | Status: DC
  Administered 2021-02-14 – 2021-02-16 (×3): 40 mg via ORAL
  Filled 2021-02-13 (×3): qty 1

## 2021-02-13 NOTE — ED Notes (Signed)
Pt more relaxed and reclining in stretcher. Sts SOB is improved and no longer using accessory muscles.  Pt sts she is feeling better and tolerating bipap well.

## 2021-02-13 NOTE — H&P (Signed)
History and Physical   NERISSA Jenkins AUQ:[id number] DOB: [birth year] DOA: 02/13/2021  PCP: [facility]   Patient coming from: Home  Chief Complaint: Shortness of breath  HPI: Doris Jenkins is a 65 y.o. female with medical history significant of bipolar disorder, COPD, hypertension, low back pain, GERD, lithium induced hypothyroidism, migraine who presents with worsening shortness of breath.  Patient on BiPAP when seen so some difficulty with comunication however history obtained with yes/no questions and the assistance of chart review and family.  Patient began having increased shortness of breath for the last couple days.  She has been using her home inhalers and nebulizers without relief.  She has been recently put on home oxygen as needed but has not been using this as it has yet to be delivered.  She was saturating 86% on room air on EMS arrival.  She was placed on CPAP in route and also given Solu-Medrol and duo nebs in route.  Placed on BiPAP in the ED. She reports fever of 101 yesterday at home.  Also reports some decreased p.o. intake for the past 2 to 3 days. She denies chest pain, abdominal pain, constipation, diarrhea, nausea.  ED Course: Vital signs in the ED significant for respiratory rate in the 20s and heart rate in the 110s.  Lab work-up showed BMP with sodium 134, glucose 146, potassium 3.1, calcium 8.6.  CBC with leukocytosis of 14.4.  Troponin normal on first check with repeat pending.  Respiratory panel for flu and COVID-negative.  Chest x-ray with hyperinflated lungs and other elements of COPD with bronchial thickening suggestive of bronchitis and COPD exacerbation.  Patient received additional duo nebs, magnesium, azithromycin in addition to being placed on BiPAP in the ED.  Review of Systems: As per HPI otherwise all other systems reviewed and are negative.  No past medical history on file.  Past Surgical History:  Procedure Laterality Date  .  ABDOMINAL HYSTERECTOMY    . BREAST BIOPSY N/A 80s    benign  . OOPHORECTOMY    . TOTAL THYROIDECTOMY      Social History  reports that she has been smoking cigarettes. She has never used smokeless tobacco. She reports that she does not drink alcohol and does not use drugs.  Allergies  Allergen Reactions  . Bee Venom Anaphylaxis  . Poison Oak Extract     Other reaction(s): Unknown  . Sulfa Antibiotics Itching    Family History  Problem Relation Age of Onset  . Cancer Mother   . Heart attack Father   Reviewed on admission  Prior to Admission medications   Medication Sig Start Date End Date Taking? Authorizing Provider  albuterol (PROVENTIL) (2.5 MG/3ML) 0.083% nebulizer solution Take 2.5 mg by nebulization every 6 (six) hours as needed for wheezing or shortness of breath.    [provider]  ALPRAZolam Prudy Feeler) 0.5 MG tablet Take 1 tablet (0.5 mg total) by mouth 2 (two) times daily. 06/07/16   Hower, Cletis Athens, MD  FLUoxetine (PROZAC) 40 MG capsule Take 40 mg by mouth daily.    [provider]  ibuprofen (ADVIL,MOTRIN) 200 MG tablet Take 600 mg by mouth every 6 (six) hours as needed for headache or mild pain.    [provider]  levothyroxine (SYNTHROID, LEVOTHROID) 75 MCG tablet Take 75 mcg by mouth daily before breakfast.    [provider]  QUEtiapine (SEROQUEL) 200 MG tablet Take 1 tablet (200 mg total) by mouth at bedtime. 06/07/16  Hower, Cletis Athens, MD  tiZANidine (ZANAFLEX) 2 MG tablet Take 2 mg by mouth every 8 (eight) hours as needed for muscle spasms.    [provider]    Physical Exam: Vitals:   02/13/21 2130 02/13/21 2200 02/13/21 2215 02/13/21 2230  BP: (!) 138/95 134/89 125/89 (!) 122/94  Pulse: (!) 109 (!) 110 (!) 111 (!) 110  Resp: (!) 32 (!) 35 (!) 29 (!) 28  Temp:      TempSrc:      SpO2: 99% 98% 98% 97%  Weight:      Height:       Physical Exam Constitutional:      General: She is not in acute distress.     Comments: Thin female who appears older than stated age.  HENT:     Head: Normocephalic and atraumatic.     Mouth/Throat:     Mouth: Mucous membranes are moist.     Pharynx: Oropharynx is clear.  Eyes:     Extraocular Movements: Extraocular movements intact.     Pupils: Pupils are equal, round, and reactive to light.  Cardiovascular:     Rate and Rhythm: Normal rate and regular rhythm.     Pulses: Normal pulses.     Heart sounds: Normal heart sounds.  Pulmonary:     Effort: Pulmonary effort is normal. No respiratory distress.     Comments: increased breath sounds on BiPAP. Abdominal:     General: Bowel sounds are normal. There is no distension.     Palpations: Abdomen is soft.     Tenderness: There is no abdominal tenderness.  Musculoskeletal:        General: No swelling or deformity.  Skin:    General: Skin is warm and dry.  Neurological:     General: No focal deficit present.     Mental Status: Mental status is at baseline.    Labs on Admission: I have personally reviewed following labs and imaging studies  CBC: Recent Labs  Lab 02/13/21 2050  WBC 14.4*  NEUTROABS 11.9*  HGB 15.0  HCT 43.7  MCV 95.2  PLT 253    Basic Metabolic Panel: Recent Labs  Lab 02/13/21 2050  NA 134*  K 3.1*  CL 98  CO2 23  GLUCOSE 146*  BUN 9  CREATININE 0.63  CALCIUM 8.6*    GFR: Estimated Creatinine Clearance: 50.9 mL/min (by C-G formula based on SCr of 0.63 mg/dL).  Liver Function Tests: No results for input(s): AST, ALT, ALKPHOS, BILITOT, PROT, ALBUMIN in the last 168 hours.  Urine analysis:    Component Value Date/Time   COLORURINE YELLOW (A) 06/06/2016 1212   APPEARANCEUR CLEAR (A) 06/06/2016 1212   LABSPEC 1.010 06/06/2016 1212   PHURINE 7.0 06/06/2016 1212   GLUCOSEU NEGATIVE 06/06/2016 1212   HGBUR NEGATIVE 06/06/2016 1212   BILIRUBINUR NEGATIVE 06/06/2016 1212   KETONESUR NEGATIVE 06/06/2016 1212   PROTEINUR NEGATIVE 06/06/2016 1212   NITRITE NEGATIVE  06/06/2016 1212   LEUKOCYTESUR NEGATIVE 06/06/2016 1212    Radiological Exams on Admission: DG Chest Port 1 View  Result Date: 02/13/2021 CLINICAL DATA:  Shortness of breath.  Respiratory distress. EXAM: PORTABLE CHEST 1 VIEW COMPARISON:  Radiograph 06/05/2016.  CT 12/17/2016 FINDINGS: Emphysema with chronic hyperinflation. Increased bronchial thickening from prior exam. No acute or focal airspace disease. Normal heart size and mediastinal contours. No pneumothorax, pulmonary edema, or significant pleural effusion. Surgical clips project over the left lung base and left axilla. IMPRESSION: Emphysema with chronic hyperinflation. Increased  bronchial thickening from prior exam may be superimposed suggest acute bronchitis/COPD exacerbation. Electronically Signed   By: Narda RutherfordMelanie  Sanford M.D.   On: 02/13/2021 21:28   EKG: Independently reviewed.  Sinus tachycardia at 114 bpm.  Some baseline artifact.  Questionable Q waves in lateral leads.  Assessment/Plan Principal Problem:   Acute respiratory failure with hypoxia (HCC) Active Problems:   Bipolar disorder (HCC)   Back pain, chronic   Essential hypertension   Hypothyroidism   Neuropathic pain   COPD with acute exacerbation (HCC)   Hypokalemia  COPD exacerbation Acute respiratory failure with hypoxia > Known history of COPD.  Increase shortness of breath for 2 days.  Not responding to home nebulizers and inhalers. > Has been prescribed oxygen as needed, but this has not yet been delivered, will need to have this arranged upon discharge.. > Saturating in the 80s on room air on EMS arrival and has received Solu-Medrol, duo nebs, magnesium, azithromycin thus far.  Is looking more comfortable after CPAP in route and then BiPAP in the ED. > Negative for flu and COVID in the ED. no evidence of pneumonia on chest x-ray.  Leukocytosis may be reactive in the setting of this lung disease exacerbation. - Monitor on stepdown for as needed BiPAP, wean as  able - Prednisone 40 mg daily - DuoNebs every 6 hours - Albuterol as needed - Switch to daily p.o. azithromycin tomorrow - Add on viral panel - VBG - Continue home Breo - Consult to transitions of care to help with home oxygen delivery.  Hypokalemia > Potassium noted to be 3.1 in the ED. > Received a dose of magnesium in the ED - Will replete with 10 mEq IV x6 doses - Trend renal function and electrolytes  Bipolar disorder - Continue home Seroquel and fluoxetine - Continue home Xanax  Hypothyroidism - Continue home Synthroid  Chronic low back pain - Continue home Zanaflex and gabapentin   GERD - Continue home PPI  DVT prophylaxis: Lovenox  Code Status:   Full  Family Communication:  Son updated at bedside Disposition Plan:   Patient is from:  Home  Anticipated DC to:  Home  Anticipated DC date:  1 to 3 days  Anticipated DC barriers: None  Consults called:  None  Admission status:  Observation, stepdown  Severity of Illness: The appropriate patient status for this patient is OBSERVATION. Observation status is judged to be reasonable and necessary in order to provide the required intensity of service to ensure the patient's safety. The patient's presenting symptoms, physical exam findings, and initial radiographic and laboratory data in the context of their medical condition is felt to place them at decreased risk for further clinical deterioration. Furthermore, it is anticipated that the patient will be medically stable for discharge from the hospital within 2 midnights of admission. The following factors support the patient status of observation.   " The patient's presenting symptoms include shortness of breath. " The physical exam findings include tachycardia, increased breath sounds on BiPAP. " The initial radiographic and laboratory data are chest x-ray with bronchial thickening and evidence of COPD, suggesting bronchitis and COPD exacerbation.  Potassium 3.1,  leukocytosis to 14.4, calcium 8.6.   Synetta FailAlexander B Eaton Folmar MD [facility]  How to contact the [facility] Attending or Consulting provider 7A - 7P or covering provider during after hours 7P -7A, for this patient?   1. Check the care team in [facility] and look for a) attending/consulting [facility] provider listed and b) the [facility]  team listed 2. Log into [email or web address] and use [place]'s universal password to access. If you do not have the password, please contact the hospital operator. 3. Locate the [facility] provider you are looking for under [facility] and page to a number that you can be directly reached. 4. If you still have difficulty reaching the provider, please page the [facility] (Director on Call) for the Hospitalists listed on amion for assistance.  02/13/2021, 11:30 PM

## 2021-02-13 NOTE — ED Triage Notes (Signed)
Pt presents via EMS from home for respiratory distress x 1 day. Per report, pt has been using her home inhalers x 2 days without relief. Hx of COPD and breast cancer. Pt was 86% on RA at home, and was given 1 duoneb and 1 albuterol; CPAP placed PTA due to work of breathing. Pt placed on bipap on arrival and tolerating well at this time. Pt is a&Ox4.

## 2021-02-13 NOTE — ED Notes (Signed)
2 sets of blood cultures obtained and sent to lab 

## 2021-02-13 NOTE — ED Provider Notes (Signed)
[facility] Emergency Department Provider Note    ____________________________________________   I have reviewed the triage vital signs and the nursing notes.   HISTORY  Chief Complaint Respiratory Distress   History limited by and level 5 caveat due to: Respiratory Distress   HPI Doris Jenkins is a 65 y.o. female who presents to the emergency department today because of concerns for respiratory distress.  Patient is unable to give any significant history secondary to respiratory distress.  She does however state that the symptoms started yesterday.  Has history of COPD.  Has been trying home breathing treatments without any significant response.  When EMS arrived her room air oxygenation was in the mid 80s.  They did place patient on CPAP and gave her Solu-Medrol as well as a DuoNeb and an albuterol treatment.  The patient states that she had a temperature of 101 yesterday.   Records reviewed. Per medical record review patient has a history of COPD  No past medical history on file.  Patient Active Problem List   Diagnosis Date Noted  . Encephalopathy 06/06/2016  . Acute delirium 06/06/2016  . Bipolar disorder (HCC) 06/06/2016    Past Surgical History:  Procedure Laterality Date  . ABDOMINAL HYSTERECTOMY    . BREAST BIOPSY N/A 80s    benign  . OOPHORECTOMY    . TOTAL THYROIDECTOMY      Prior to Admission medications   Medication Sig Start Date End Date Taking? Authorizing Provider  albuterol (PROVENTIL) (2.5 MG/3ML) 0.083% nebulizer solution Take 2.5 mg by nebulization every 6 (six) hours as needed for wheezing or shortness of breath.    [provider]  ALPRAZolam Prudy Feeler) 0.5 MG tablet Take 1 tablet (0.5 mg total) by mouth 2 (two) times daily. 06/07/16   Hower, Cletis Athens, MD  FLUoxetine (PROZAC) 40 MG capsule Take 40 mg by mouth daily.    [provider]  ibuprofen (ADVIL,MOTRIN) 200 MG tablet Take 600 mg by mouth every 6 (six)  hours as needed for headache or mild pain.    [provider]  levothyroxine (SYNTHROID, LEVOTHROID) 75 MCG tablet Take 75 mcg by mouth daily before breakfast.    [provider]  QUEtiapine (SEROQUEL) 200 MG tablet Take 1 tablet (200 mg total) by mouth at bedtime. 06/07/16   Hower, Cletis Athens, MD  tiZANidine (ZANAFLEX) 2 MG tablet Take 2 mg by mouth every 8 (eight) hours as needed for muscle spasms.    [provider]    Allergies Bee venom, Poison oak extract, and Sulfa antibiotics  No family history on file.  Social History Social History   Tobacco Use  . Smoking status: Current Every Day Smoker    Types: Cigarettes  . Smokeless tobacco: Never Used  Substance Use Topics  . Alcohol use: No  . Drug use: No    Review of Systems Constitutional: Positive for fever.  Eyes: No visual changes. ENT: No sore throat. Cardiovascular: Positive for chest tightness.  Respiratory: Positive for shortness of breath. Gastrointestinal: No abdominal pain.  No nausea, no vomiting.  No diarrhea.   Genitourinary: Negative for dysuria. Musculoskeletal: Negative for back pain. Skin: Negative for rash. Neurological: Negative for headaches, focal weakness or numbness.  ____________________________________________   PHYSICAL EXAM:  VITAL SIGNS: ED Triage Vitals  Enc Vitals Group     BP 02/13/21 2047 (!) 131/96     Pulse Rate 02/13/21 2047 (!) 114     Resp 02/13/21 2047 (!) 30  Temp 02/13/21 2047 98.1 F (36.7 C)     Temp Source 02/13/21 2047 Oral     SpO2 02/13/21 2051 97 %     Weight 02/13/21 2048 98 lb 12.8 oz (44.8 kg)     Height 02/13/21 2048 5\' 4"  (1.626 m)     Head Circumference --      Peak Flow --      Pain Score 02/13/21 2053 8   Constitutional: Alert and oriented.  Eyes: Conjunctivae are normal.  ENT      Head: Normocephalic and atraumatic.      Nose: No congestion/rhinnorhea.      Mouth/Throat: Mucous membranes are moist.      Neck: No  stridor. Hematological/Lymphatic/Immunilogical: No cervical lymphadenopathy. Cardiovascular: Tachycardia, regular rhythm.  No murmurs, rubs, or gallops.  Respiratory: Increased respiratory effort. Poor air movement. Anterior expiratory wheezing.  Gastrointestinal: Soft and non tender. No rebound. No guarding.  Genitourinary: Deferred Musculoskeletal: Normal range of motion in all extremities. No lower extremity edema. Neurologic:  Normal speech and language. No gross focal neurologic deficits are appreciated.  Skin:  Skin is warm, dry and intact. No rash noted. Psychiatric: Mood and affect are normal. Speech and behavior are normal. Patient exhibits appropriate insight and judgment.  ____________________________________________    LABS (pertinent positives/negatives)  COVID negative Trop hs 8 BMP na 134, k 3.1, glu 146, cr 0.63 CBC wbc 14.4, hgb 15.0, plt 253  ____________________________________________   EKG  I, 2054, attending physician, personally viewed and interpreted this EKG  EKG Time: 2047 Rate: 114 Rhythm: sinus tachycardia Axis: right axis deviation Intervals: qtc 451 QRS: narrow ST changes: no st elevation Impression: abnormal ekg   ____________________________________________    RADIOLOGY  CXR Emphysema, findings suggestive of acute bronchitis/copd exacerbation  ____________________________________________   PROCEDURES  Procedures  CRITICAL CARE Performed by: 2048   Total critical care time: 35 minutes  Critical care time was exclusive of separately billable procedures and treating other patients.  Critical care was necessary to treat or prevent imminent or life-threatening deterioration.  Critical care was time spent personally by me on the following activities: development of treatment plan with patient and/or surrogate as well as nursing, discussions with consultants, evaluation of patient's response to treatment,  examination of patient, obtaining history from patient or surrogate, ordering and performing treatments and interventions, ordering and review of laboratory studies, ordering and review of radiographic studies, pulse oximetry and re-evaluation of patient's condition.  ____________________________________________   INITIAL IMPRESSION / ASSESSMENT AND PLAN / ED COURSE  Pertinent labs & imaging results that were available during my care of the patient were reviewed by me and considered in my medical decision making (see chart for details).   Patient presents to the emergency department today because of concerns for shortness of breath that started yesterday.  Patient arrived as emergency traffic and had been put on CPAP by EMS.  Patient was transferred to BiPAP here in the emergency department.  She had already received Solu-Medrol from EMS.  Patient was given further breathing treatments here in the emergency department.  Chest x-ray did not show any acute pneumonia.  Was concern for COPD exacerbation.  Discussed findings with patient.  Will plan on admission to the hospital service.   ____________________________________________   FINAL CLINICAL IMPRESSION(S) / ED DIAGNOSES  Final diagnoses:  COPD exacerbation (HCC)  Hypoxia  Respiratory distress     Note: This dictation was prepared with Dragon dictation. Any transcriptional errors that result from this  process are unintentional     Phineas Semen, MD 02/13/21 2252

## 2021-02-13 NOTE — ED Notes (Signed)
Admitting MD Alinda Money at bedside.

## 2021-02-13 NOTE — ED Notes (Signed)
ED Provider at bedside. 

## 2021-02-14 ENCOUNTER — Encounter: Payer: Self-pay | Admitting: Internal Medicine

## 2021-02-14 ENCOUNTER — Other Ambulatory Visit: Payer: Self-pay

## 2021-02-14 DIAGNOSIS — Z882 Allergy status to sulfonamides status: Secondary | ICD-10-CM | POA: Diagnosis not present

## 2021-02-14 DIAGNOSIS — M545 Low back pain, unspecified: Secondary | ICD-10-CM | POA: Diagnosis present

## 2021-02-14 DIAGNOSIS — I10 Essential (primary) hypertension: Secondary | ICD-10-CM | POA: Diagnosis present

## 2021-02-14 DIAGNOSIS — J439 Emphysema, unspecified: Secondary | ICD-10-CM | POA: Diagnosis present

## 2021-02-14 DIAGNOSIS — J9601 Acute respiratory failure with hypoxia: Secondary | ICD-10-CM | POA: Diagnosis present

## 2021-02-14 DIAGNOSIS — Z20822 Contact with and (suspected) exposure to covid-19: Secondary | ICD-10-CM | POA: Diagnosis present

## 2021-02-14 DIAGNOSIS — Z79899 Other long term (current) drug therapy: Secondary | ICD-10-CM | POA: Diagnosis not present

## 2021-02-14 DIAGNOSIS — Z681 Body mass index (BMI) 19 or less, adult: Secondary | ICD-10-CM | POA: Diagnosis not present

## 2021-02-14 DIAGNOSIS — E440 Moderate protein-calorie malnutrition: Secondary | ICD-10-CM | POA: Diagnosis present

## 2021-02-14 DIAGNOSIS — K219 Gastro-esophageal reflux disease without esophagitis: Secondary | ICD-10-CM | POA: Diagnosis present

## 2021-02-14 DIAGNOSIS — Z90710 Acquired absence of both cervix and uterus: Secondary | ICD-10-CM | POA: Diagnosis not present

## 2021-02-14 DIAGNOSIS — E890 Postprocedural hypothyroidism: Secondary | ICD-10-CM | POA: Diagnosis present

## 2021-02-14 DIAGNOSIS — Z8249 Family history of ischemic heart disease and other diseases of the circulatory system: Secondary | ICD-10-CM | POA: Diagnosis not present

## 2021-02-14 DIAGNOSIS — E876 Hypokalemia: Secondary | ICD-10-CM | POA: Diagnosis present

## 2021-02-14 DIAGNOSIS — G8929 Other chronic pain: Secondary | ICD-10-CM | POA: Diagnosis present

## 2021-02-14 DIAGNOSIS — Z79890 Hormone replacement therapy (postmenopausal): Secondary | ICD-10-CM | POA: Diagnosis not present

## 2021-02-14 DIAGNOSIS — F17210 Nicotine dependence, cigarettes, uncomplicated: Secondary | ICD-10-CM | POA: Diagnosis present

## 2021-02-14 DIAGNOSIS — R0603 Acute respiratory distress: Secondary | ICD-10-CM | POA: Diagnosis present

## 2021-02-14 DIAGNOSIS — F319 Bipolar disorder, unspecified: Secondary | ICD-10-CM | POA: Diagnosis present

## 2021-02-14 LAB — BASIC METABOLIC PANEL
Anion gap: 9 (ref 5–15)
BUN: 10 mg/dL (ref 8–23)
CO2: 24 mmol/L (ref 22–32)
Calcium: 7.8 mg/dL — ABNORMAL LOW (ref 8.9–10.3)
Chloride: 104 mmol/L (ref 98–111)
Creatinine, Ser: 0.57 mg/dL (ref 0.44–1.00)
GFR, Estimated: >60 mL/min (ref >60)
Glucose, Bld: 169 mg/dL — ABNORMAL HIGH (ref 70–99)
Potassium: 3.7 mmol/L (ref 3.5–5.1)
Sodium: 137 mmol/L (ref 135–145)

## 2021-02-14 LAB — CBC
HCT: 43.4 % (ref 36.0–46.0)
Hemoglobin: 14.7 g/dL (ref 12.0–15.0)
MCH: 32.4 pg (ref 26.0–34.0)
MCHC: 33.9 g/dL (ref 30.0–36.0)
MCV: 95.6 fL (ref 80.0–100.0)
Platelets: 210 10*3/uL (ref 150–400)
RBC: 4.54 MIL/uL (ref 3.87–5.11)
RDW: 14 % (ref 11.5–15.5)
WBC: 9.1 10*3/uL (ref 4.0–10.5)
nRBC: 0 % (ref 0.0–0.2)

## 2021-02-14 LAB — TROPONIN I (HIGH SENSITIVITY): Troponin I (High Sensitivity): 6 ng/L (ref <18)

## 2021-02-14 LAB — HIV ANTIBODY (ROUTINE TESTING W REFLEX): HIV Screen 4th Generation wRfx: NONREACTIVE

## 2021-02-14 MED ORDER — FLUTICASONE FUROATE-VILANTEROL 100-25 MCG/INH IN AEPB: 1.0000 | INHALATION_SPRAY | Freq: Every day | RESPIRATORY_TRACT | Status: DC

## 2021-02-14 MED ORDER — ALPRAZOLAM 0.5 MG PO TABS
1.0000 mg | ORAL_TABLET | Freq: Four times a day (QID) | ORAL | Status: DC
  Administered 2021-02-14 – 2021-02-16 (×7): 1 mg via ORAL
  Filled 2021-02-14 (×7): qty 2

## 2021-02-14 MED ORDER — ALPRAZOLAM 0.5 MG PO TABS
0.5000 mg | ORAL_TABLET | Freq: Once | ORAL | Status: AC
  Administered 2021-02-14: 0.5 mg via ORAL
  Filled 2021-02-14: qty 1

## 2021-02-14 MED ORDER — TIOTROPIUM BROMIDE MONOHYDRATE 18 MCG IN CAPS
1.0000 | ORAL_CAPSULE | Freq: Every day | RESPIRATORY_TRACT | Status: DC
  Administered 2021-02-14 – 2021-02-16 (×3): 18 ug via RESPIRATORY_TRACT
  Filled 2021-02-14: qty 5

## 2021-02-14 MED ORDER — PANTOPRAZOLE SODIUM 40 MG PO TBEC: 40.0000 mg | DELAYED_RELEASE_TABLET | Freq: Every day | ORAL | Status: DC

## 2021-02-14 MED ORDER — POTASSIUM CHLORIDE CRYS ER 20 MEQ PO TBCR
20.0000 meq | EXTENDED_RELEASE_TABLET | Freq: Every day | ORAL | Status: DC
  Administered 2021-02-14 – 2021-02-16 (×3): 20 meq via ORAL
  Filled 2021-02-14 (×2): qty 1

## 2021-02-14 MED ORDER — PREDNISONE 50 MG PO TABS
50.0000 mg | ORAL_TABLET | Freq: Every day | ORAL | Status: DC
  Administered 2021-02-15 – 2021-02-16 (×2): 50 mg via ORAL
  Filled 2021-02-14 (×2): qty 1

## 2021-02-14 MED ORDER — ALBUTEROL SULFATE HFA 108 (90 BASE) MCG/ACT IN AERS: 2.0000 | INHALATION_SPRAY | RESPIRATORY_TRACT | Status: DC | PRN

## 2021-02-14 MED ORDER — GABAPENTIN 400 MG PO CAPS
400.0000 mg | ORAL_CAPSULE | Freq: Four times a day (QID) | ORAL | Status: DC
  Administered 2021-02-14 – 2021-02-16 (×6): 400 mg via ORAL
  Filled 2021-02-14 (×6): qty 1

## 2021-02-14 NOTE — ED Notes (Signed)
Pt resting with eyes closed at this time.  Pt continues to do well on 3L O2 via [place].  Will continue to monitor.

## 2021-02-14 NOTE — ED Notes (Signed)
Pt resp reg/unlabored; remains on 3L via [place]; cough present; called dietary who stated previous RN had just called to order diet tray and said they'll send soon; pt give 2 new warm blankets; stretcher locked low; rail up; belongings within reach; call bell within reach.

## 2021-02-14 NOTE — Plan of Care (Signed)
D

## 2021-02-14 NOTE — ED Notes (Signed)
Brewing technologist putting in transport request now.

## 2021-02-14 NOTE — Progress Notes (Signed)
[facility]  - [place] at [facility]   PATIENT NAME: Doris Jenkins    MR#:  [id number]  DATE OF BIRTH:  Aug 14, 1956  SUBJECTIVE:   Patient came in with increasing shortness of breath. She is much improved. Denies any cough. Does not use oxygen at home. Currently on 3 L nasal cannula oxygen sats are 92-96%. No family in the room. REVIEW OF SYSTEMS:   Review of Systems  Constitutional: Negative for chills, fever and weight loss.  HENT: Negative for ear discharge, ear pain and nosebleeds.   Eyes: Negative for blurred vision, pain and discharge.  Respiratory: Positive for cough and shortness of breath. Negative for sputum production, wheezing and stridor.   Cardiovascular: Negative for chest pain, palpitations, orthopnea and PND.  Gastrointestinal: Negative for abdominal pain, diarrhea, nausea and vomiting.  Genitourinary: Negative for frequency and urgency.  Musculoskeletal: Negative for back pain and joint pain.  Neurological: Positive for weakness. Negative for sensory change, speech change and focal weakness.  Psychiatric/Behavioral: Negative for depression and hallucinations. The patient is not nervous/anxious.    Tolerating Diet:yes Tolerating PT:   DRUG ALLERGIES:   Allergies  Allergen Reactions  . Bee Venom Anaphylaxis  . Poison Oak Extract     Other reaction(s): Unknown  . Sulfa Antibiotics Itching    VITALS:  Blood pressure 123/85, pulse 95, temperature 97.9 F (36.6 C), temperature source Oral, resp. rate 18, height 5\' 4"  (1.626 m), weight 44.8 kg, SpO2 90 %.  PHYSICAL EXAMINATION:   Physical Exam  GENERAL:  65 y.o.-year-old patient lying in the bed with no acute distress.  LUNGS distant breath sounds bilaterally, no wheezing, rales, rhonchi. No use of accessory muscles of respiration.  CARDIOVASCULAR: S1, S2 normal. No murmurs, rubs, or gallops.  ABDOMEN: Soft, nontender, nondistended. Bowel sounds present. No organomegaly or mass.  EXTREMITIES:  No cyanosis, clubbing or edema b/l.    NEUROLOGIC:nonfocal PSYCHIATRIC:  patient is alert and oriented x 3.  SKIN: No obvious rash, lesion, or ulcer.   LABORATORY PANEL:  CBC Recent Labs  Lab 02/14/21 0436  WBC 9.1  HGB 14.7  HCT 43.4  PLT 210    Chemistries  Recent Labs  Lab 02/14/21 0436  NA 137  K 3.7  CL 104  CO2 24  GLUCOSE 169*  BUN 10  CREATININE 0.57  CALCIUM 7.8*   Cardiac Enzymes No results for input(s): TROPONINI in the last 168 hours. RADIOLOGY:  DG Chest Port 1 View  Result Date: 02/13/2021 CLINICAL DATA:  Shortness of breath.  Respiratory distress. EXAM: PORTABLE CHEST 1 VIEW COMPARISON:  Radiograph 06/05/2016.  CT 12/17/2016 FINDINGS: Emphysema with chronic hyperinflation. Increased bronchial thickening from prior exam. No acute or focal airspace disease. Normal heart size and mediastinal contours. No pneumothorax, pulmonary edema, or significant pleural effusion. Surgical clips project over the left lung base and left axilla. IMPRESSION: Emphysema with chronic hyperinflation. Increased bronchial thickening from prior exam may be superimposed suggest acute bronchitis/COPD exacerbation. Electronically Signed   By: 02/16/2017 M.D.   On: 02/13/2021 21:28   ASSESSMENT AND PLAN:   [name] is a 65 y.o. female with medical history significant of bipolar disorder, COPD, hypertension, low back pain, GERD, lithium induced hypothyroidism, migraine who presents with worsening shortness of breath.  COPD exacerbation/acute hypoxic respiratory failure Emphysema tobacco abuse -- negative for flu and COVID -- PRN nebs and inhalers -- PO prednisone -- empiric PO Zithromax -- respiratory panel pending -- assess for home oxygen  Hypokalemia --  PO potassium  Bipolar disorder -- continue Seroquel, fluoxetine, PRN Xanax  Chronic low back pain -- gabapentin and Zanaflex  Gerd -- PPI  Hypothyroidism -- continue Synthroid  Malnutrition moderate protein  calorie -- will encourage p.o. nutritional intake along with ensure  Procedures: Family communication : Consults :none CODE STATUS: full DVT Prophylaxis :lovenox Level of care: Med-Surg Status is: Inpatient  Remains inpatient appropriate because:Inpatient level of care appropriate due to severity of illness   Dispo: The patient is from: Home              Anticipated d/c is to: Home              Patient currently is medically stable to d/c.   Difficult to place patient No        TOTAL TIME TAKING CARE OF THIS PATIENT: 25 minutes.  >50% time spent on counselling and coordination of care  Note: This dictation was prepared with Dragon dictation along with smaller phrase technology. Any transcriptional errors that result from this process are unintentional.  Enedina Finner M.D    [facility]   CC: Primary care physician; [facility] HealthPatient ID: Doris Jenkins, female   DOB: Feb 25, 1956, 65 y.o.   MRN: [id number]

## 2021-02-14 NOTE — ED Notes (Signed)
Pt removed bipap and states she needs a break from it due to anxiety. Medication given per request. Pt placed on 4L [place] at this time. Tolerating fairly well.

## 2021-02-14 NOTE — ED Notes (Signed)
Admitting MD notified of pt request for dose of xanax now. Currently scheduled for AM dose. See new order.

## 2021-02-14 NOTE — ED Notes (Signed)
NT gave pt her lunch tray.

## 2021-02-15 NOTE — Progress Notes (Signed)
[facility]  - [facility]   PATIENT NAME: Doris Jenkins    MR#:  [id number]  DATE OF BIRTH:  13-Jan-1956  SUBJECTIVE:   Patient came in with increasing shortness of breath. She is much improved. Denies any cough. Does not use oxygen at home. Currently on 3 L nasal cannula oxygen sats are 92-96%. No family in the room.  Feels tired today REVIEW OF SYSTEMS:   Review of Systems  Constitutional: Negative for chills, fever and weight loss.  HENT: Negative for ear discharge, ear pain and nosebleeds.   Eyes: Negative for blurred vision, pain and discharge.  Respiratory: Positive for cough and shortness of breath. Negative for sputum production, wheezing and stridor.   Cardiovascular: Negative for chest pain, palpitations, orthopnea and PND.  Gastrointestinal: Negative for abdominal pain, diarrhea, nausea and vomiting.  Genitourinary: Negative for frequency and urgency.  Musculoskeletal: Negative for back pain and joint pain.  Neurological: Positive for weakness. Negative for sensory change, speech change and focal weakness.  Psychiatric/Behavioral: Negative for depression and hallucinations. The patient is not nervous/anxious.    Tolerating Diet:yes Tolerating PT: pending  DRUG ALLERGIES:   Allergies  Allergen Reactions  . Bee Venom Anaphylaxis  . Poison Oak Extract     Other reaction(s): Unknown  . Sulfa Antibiotics Itching    VITALS:  Blood pressure 116/87, pulse (!) 110, temperature 97.7 F (36.5 C), resp. rate 20, height 5\' 4"  (1.626 m), weight 45.7 kg, SpO2 93 %.  PHYSICAL EXAMINATION:   Physical Exam  GENERAL:  65 y.o.-year-old patient lying in the bed with no acute distress.  LUNGS distant breath sounds bilaterally, no wheezing, rales, rhonchi. No use of accessory muscles of respiration.  CARDIOVASCULAR: S1, S2 normal. No murmurs, rubs, or gallops.  ABDOMEN: Soft, nontender, nondistended. Bowel sounds present. No organomegaly or mass.   EXTREMITIES: No cyanosis, clubbing or edema b/l.    NEUROLOGIC:nonfocal PSYCHIATRIC:  patient is alert and oriented x 3.  SKIN: No obvious rash, lesion, or ulcer.   LABORATORY PANEL:  CBC Recent Labs  Lab 02/14/21 0436  WBC 9.1  HGB 14.7  HCT 43.4  PLT 210    Chemistries  Recent Labs  Lab 02/14/21 0436  NA 137  K 3.7  CL 104  CO2 24  GLUCOSE 169*  BUN 10  CREATININE 0.57  CALCIUM 7.8*   Cardiac Enzymes No results for input(s): TROPONINI in the last 168 hours. RADIOLOGY:  DG Chest Port 1 View  Result Date: 02/13/2021 CLINICAL DATA:  Shortness of breath.  Respiratory distress. EXAM: PORTABLE CHEST 1 VIEW COMPARISON:  Radiograph 06/05/2016.  CT 12/17/2016 FINDINGS: Emphysema with chronic hyperinflation. Increased bronchial thickening from prior exam. No acute or focal airspace disease. Normal heart size and mediastinal contours. No pneumothorax, pulmonary edema, or significant pleural effusion. Surgical clips project over the left lung base and left axilla. IMPRESSION: Emphysema with chronic hyperinflation. Increased bronchial thickening from prior exam may be superimposed suggest acute bronchitis/COPD exacerbation. Electronically Signed   By: 02/16/2017 M.D.   On: 02/13/2021 21:28   ASSESSMENT AND PLAN:   RASHEDA LEDGER is a 65 y.o. female with medical history significant of bipolar disorder, COPD, hypertension, low back pain, GERD, lithium induced hypothyroidism, migraine who presents with worsening shortness of breath.  COPD exacerbation/acute hypoxic respiratory failure Emphysema tobacco abuse -- negative for flu and COVID -- PRN nebs and inhalers -- PO prednisone -- empiric PO Zithromax -- respiratory panel pending -- assess for home oxygen--pt  qualifies for home oxygen. TOC inofrmed  Hypokalemia -- PO potassium  Bipolar disorder -- continue Seroquel, fluoxetine, PRN Xanax  Chronic low back pain -- gabapentin and Zanaflex  Gerd --  PPI  Hypothyroidism -- continue Synthroid  Malnutrition moderate protein calorie -- will encourage p.o. nutritional intake along with ensure  Procedures: Family communication : son Jonny Ruiz Chimenti Consults :none CODE STATUS: full DVT Prophylaxis :lovenox Level of care: Med-Surg Status is: Inpatient  Remains inpatient appropriate because:Inpatient level of care appropriate due to severity of illness   Dispo: The patient is from: Home              Anticipated d/c is to: Home         patient clinically improving. Will continue to monitor for one more day discharge likely tomorrow with home oxygen.       TOTAL TIME TAKING CARE OF THIS PATIENT: 25 minutes.  >50% time spent on counselling and coordination of care  Note: This dictation was prepared with Dragon dictation along with smaller phrase technology. Any transcriptional errors that result from this process are unintentional.  Enedina Finner M.D    [facility]   CC: Primary care physician; [facility] HealthPatient ID: TAETUM FLEWELLEN, female   DOB: 1955-11-07, 65 y.o.   MRN: [id number]

## 2021-02-15 NOTE — Progress Notes (Signed)
SATURATION QUALIFICATIONS: (This note is used to comply with regulatory documentation for home oxygen)  Patient Saturations on Room Air at Rest = 84%  Patient Saturations on Room Air while Ambulating = 80%  Patient Saturations on 3 Liters of oxygen while Ambulating = 91%  Please briefly explain why patient needs home oxygen: COPD

## 2021-02-15 NOTE — Evaluation (Signed)
Physical Therapy Evaluation Patient Details Name: Doris Jenkins MRN: [id number] DOB: 01-Oct-1955 Today's Date: 02/15/2021   History of Present Illness  Pt admitted for ARF with hypoxia with complaints of resp distress. History includes bipolar, COPD, HTN, and GERD.  Clinical Impression  Pt is a pleasant 65 year old female who was admitted for ARF with hypoxia. Pt performs bed mobility with independence, transfers with cga, and ambulation with supervision using RW. All mobility performed on 3L of O2 with sats at 92%. Pt demonstrates deficits with endurance/mobility/strength. Pt is very eager to discharge and is feeling better. Has good support from son. Would benefit from skilled PT to address above deficits and promote optimal return to PLOF. Recommend transition to HHPT upon discharge from acute hospitalization.    Follow Up Recommendations Home health PT    Equipment Recommendations  Rolling walker with 5" wheels    Recommendations for Other Services       Precautions / Restrictions Precautions Precautions: Fall Restrictions Weight Bearing Restrictions: No      Mobility  Bed Mobility Overal bed mobility: Independent             General bed mobility comments: Safe technique with upright posture    Transfers Overall transfer level: Needs assistance Equipment used: Rolling walker (2 wheeled) Transfers: Sit to/from Stand Sit to Stand: Min guard         General transfer comment: cues for hand placement. Upright posture. RW adjusted to patient height  Ambulation/Gait Ambulation/Gait assistance: Supervision Gait Distance (Feet): 200 Feet Assistive device: Rolling walker (2 wheeled) Gait Pattern/deviations: Step-through pattern     General Gait Details: slow gait speed with decreased endurance noted. All mobility performed on 3L of O2 with sats at 92% with exertion. Isn't able to carry conversation during exertion, requires to stop.  Stairs             Wheelchair Mobility    Modified Rankin (Stroke Patients Only)       Balance Overall balance assessment: Needs assistance Sitting-balance support: Feet supported Sitting balance-Leahy Scale: Good     Standing balance support: Bilateral upper extremity supported Standing balance-Leahy Scale: Good                               Pertinent Vitals/Pain Pain Assessment: No/denies pain    Home Living Family/patient expects to be discharged to:: Private residence Living Arrangements: Alone Available Help at Discharge: Family;Available PRN/intermittently Type of Home: House Home Access: Stairs to enter Entrance Stairs-Rails: Right Entrance Stairs-Number of Steps: 4 Home Layout: One level Home Equipment: None      Prior Function Level of Independence: Independent         Comments: reports no recent falls. Indep with all ADLs     Hand Dominance        Extremity/Trunk Assessment   Upper Extremity Assessment Upper Extremity Assessment: Overall WFL for tasks assessed    Lower Extremity Assessment Lower Extremity Assessment: Generalized weakness (B LE grossly 3+/5 and reports B tingling)       Communication   Communication: No difficulties  Cognition Arousal/Alertness: Awake/alert Behavior During Therapy: WFL for tasks assessed/performed Overall Cognitive Status: Within Functional Limits for tasks assessed                                        General Comments  Exercises Other Exercises Other Exercises: Educated on continuing exercises at home including standing marching and mini squats. Demonstrated at bathroom counter. Educated on frequency and duration.   Assessment/Plan    PT Assessment Patient needs continued PT services  PT Problem List Decreased strength;Decreased activity tolerance;Decreased mobility;Cardiopulmonary status limiting activity       PT Treatment Interventions Gait training;Therapeutic  exercise;Balance training    PT Goals (Current goals can be found in the Care Plan section)  Acute Rehab PT Goals Patient Stated Goal: to go home PT Goal Formulation: With patient Time For Goal Achievement: 03/01/21 Potential to Achieve Goals: Good    Frequency Min 2X/week   Barriers to discharge        Co-evaluation               AM-PAC PT "6 Clicks" Mobility  Outcome Measure Help needed turning from your back to your side while in a flat bed without using bedrails?: None Help needed moving from lying on your back to sitting on the side of a flat bed without using bedrails?: None Help needed moving to and from a bed to a chair (including a wheelchair)?: A Little Help needed standing up from a chair using your arms (e.g., wheelchair or bedside chair)?: A Little Help needed to walk in hospital room?: A Little Help needed climbing 3-5 steps with a railing? : A Little 6 Click Score: 20    End of Session Equipment Utilized During Treatment: Gait belt;Oxygen Activity Tolerance: Patient tolerated treatment well Patient left: in bed;with bed alarm set (seated at EOB) Nurse Communication: Mobility status PT Visit Diagnosis: Unsteadiness on feet (R26.81);Muscle weakness (generalized) (M62.81);Difficulty in walking, not elsewhere classified (R26.2)    Time: 2202-5427 PT Time Calculation (min) (ACUTE ONLY): 25 min   Charges:   PT Evaluation $PT Eval Low Complexity: 1 Low PT Treatments $Gait Training: 8-22 mins        Elizabeth Palau, PT, DPT [phone]   Shacola Schussler 02/15/2021, 4:00 PM

## 2021-02-15 NOTE — TOC Initial Note (Signed)
Transition of Care [facility]) - Initial/Assessment Note    Patient Details  Name: Doris Jenkins MRN: [id number] Date of Birth: 29-Aug-1956  [facility] CM/SW Contact:    Luvenia Redden, RN Phone Number: [phone] 02/15/2021, 3:09 PM  Clinical Narrative:                 RN spoke with pt today concerning recommendations for home O2 and possible DME (3-1 commode) due to pt' ongoing weakness. Pt agreed to any agency capable of accommodating her ongoing care .  [employer] willing to delivery portable O2 and set up concentrator at her new local address noted in Epic however on HOLD w/ this delivery awaiting PT/OT evaluation for any additional DME on this delivery. Will reach out to [employer] once verified. Pt lives in an apartment no steps.  Pt reports she has a son Doris Jenkins) who is able to assist. Pt can afford her medications via [facility] [address]  [facility] will remains available to assist accordingly with pt's ongoing discharge.  Expected Discharge Plan: Home/Self Care Barriers to Discharge: Continued Medical Work up   Patient Goals and CMS Choice        Expected Discharge Plan and Services Expected Discharge Plan: Home/Self Care   Discharge Planning Services: CM Consult   Living arrangements for the past 2 months: Apartment                 DME Arranged: Oxygen DME Agency: Other - Comment ([employer]) Date DME Agency Contacted: 02/15/21 Time DME Agency Contacted: 1330 Representative spoke with at DME Agency: Vaughan Basta            Prior Living Arrangements/Services Living arrangements for the past 2 months: Apartment Lives with:: Self Patient language and need for interpreter reviewed:: Yes Do you feel safe going back to the place where you live?: Yes      Need for Family Participation in Patient Care: Yes (Comment) Care giver support system in place?: Yes (comment)   Criminal Activity/Legal Involvement Pertinent to Current Situation/Hospitalization: No - Comment as  needed  Activities of Daily Living Home Assistive Devices/Equipment: None ADL Screening (condition at time of admission) Patient's cognitive ability adequate to safely complete daily activities?: Yes Is the patient deaf or have difficulty hearing?: No Does the patient have difficulty seeing, even when wearing glasses/contacts?: No Does the patient have difficulty concentrating, remembering, or making decisions?: No Patient able to express need for assistance with ADLs?: Yes Does the patient have difficulty dressing or bathing?: No Independently performs ADLs?: Yes (appropriate for developmental age) Does the patient have difficulty walking or climbing stairs?: Yes (sometimes) Weakness of Legs: Both Weakness of Arms/Hands: Both  Permission Sought/Granted   Permission granted to share information with : Yes, Verbal Permission Granted              Emotional Assessment Appearance:: Appears stated age Attitude/Demeanor/Rapport: Engaged Affect (typically observed): Accepting Orientation: : Oriented to Self,Oriented to Place,Oriented to  Time,Oriented to Situation Alcohol / Substance Use: Not Applicable Psych Involvement: No (comment)  Admission diagnosis:  Respiratory distress [R06.03] Hypoxia [R09.02] COPD exacerbation (HCC) [J44.1] Acute respiratory failure with hypoxia (HCC) [J96.01] Acute hypoxemic respiratory failure (HCC) [J96.01] Patient Active Problem List   Diagnosis Date Noted  . Acute hypoxemic respiratory failure (HCC) 02/14/2021  . Acute respiratory failure with hypoxia (HCC) 02/13/2021  . COPD with acute exacerbation (HCC) 02/13/2021  . Hypokalemia 02/13/2021  . Encephalopathy 06/06/2016  . Acute delirium 06/06/2016  . Bipolar disorder (HCC)  06/06/2016  . Neuropathic pain 05/09/2015  . Essential hypertension 06/05/2014  . Back pain, chronic 07/27/2013  . COPD (chronic obstructive pulmonary disease) (HCC) 07/27/2013  . Hypothyroidism 07/27/2013   PCP:   [facility]:   [phone] [place], [place] - [address] [place] [address] Phone: [phone] Fax: [phone]     Social Determinants of Health (SDOH) Interventions    Readmission Risk Interventions No flowsheet data found.

## 2021-02-16 MED ORDER — AZITHROMYCIN 500 MG PO TABS: 500.0000 mg | ORAL_TABLET | Freq: Every day | ORAL | 0 refills | Status: DC

## 2021-02-16 MED ORDER — PREDNISONE 10 MG PO TABS: ORAL_TABLET | ORAL | 0 refills | Status: DC

## 2021-02-16 NOTE — TOC Progression Note (Signed)
Transition of Care [facility]) - Progression Note    Patient Details  Name: Doris Jenkins MRN: [id number] Date of Birth: [birth year]  [facility] CM/SW Kohler, RN Phone Number: 02/16/2021, 9:14 AM  Clinical Narrative:   Met with the patient in the room, She has the Home O2 tank and RW in the room from [employer], she stated her son will be picking her up, she can affrod her medications, no additional needs   Expected Discharge Plan: Home/Self Care Barriers to Discharge: Continued Medical Work up  Expected Discharge Plan and Services Expected Discharge Plan: Home/Self Care   Discharge Planning Services: CM Consult   Living arrangements for the past 2 months: Apartment                 DME Arranged: Oxygen DME Agency: Other - Comment ([employer]) Date DME Agency Contacted: 02/15/21 Time DME Agency Contacted: 1330 Representative spoke with at DME Agency: Brenton Grills             Social Determinants of Health ([place]) Interventions    Readmission Risk Interventions No flowsheet data found.

## 2021-02-16 NOTE — Discharge Summary (Signed)
[facility] [place] [facility]   PATIENT NAME: Doris Jenkins    MR#:  [id number]  DATE OF BIRTH:  03/16/1956  DATE OF ADMISSION:  02/13/2021 ADMITTING PHYSICIAN: Enedina Finner, MD  DATE OF DISCHARGE: 02/16/2021  PRIMARY CARE PHYSICIAN: [facility]    ADMISSION DIAGNOSIS:  Respiratory distress [R06.03] Hypoxia [R09.02] COPD exacerbation (HCC) [J44.1] Acute respiratory failure with hypoxia (HCC) [J96.01] Acute hypoxemic respiratory failure (HCC) [J96.01]  DISCHARGE DIAGNOSIS:  acute hypoxic respiratory failure secondary to COPD/emphysema exacerbation long history of tobacco abuse  SECONDARY DIAGNOSIS:  History reviewed. No pertinent past medical history.  HOSPITAL COURSE:  Doris Jenkins a 65 y.o.femalewith medical history significant ofbipolar disorder, COPD, hypertension, low back pain, GERD, lithium induced hypothyroidism, migraine who presents with worsening shortness of breath.  COPD exacerbation/acute hypoxic respiratory failure Emphysema tobacco abuse -- negative for flu and COVID -- PRN nebs and inhalers -- PO prednisone -- empiric PO Zithromax -- respiratory panel pending -- assess for home oxygen--pt qualifies for home oxygen.  --f/u Dr Meredeth Ide as outpt  Hypokalemia -- PO potassium  Bipolar disorder -- continue Seroquel, fluoxetine, PRN Xanax  Chronic low back pain -- gabapentin and Zanaflex  Gerd -- PPI  Hypothyroidism -- continue Synthroid  Malnutrition moderate protein calorie -- will encourage p.o. nutritional intake along with ensure  Procedures: Family communication : son Jonny Ruiz Rosenow Consults :none CODE STATUS: full DVT Prophylaxis :lovenox Level of care: Med-Surg Status is: Inpatient  Remains inpatient appropriate because:Inpatient level of care appropriate due to severity of illness   Dispo: The patient is from: Home  Anticipated d/c is to: Home today with  oxygen  CONSULTS OBTAINED:    DRUG ALLERGIES:   Allergies  Allergen Reactions  . Bee Venom Anaphylaxis  . Poison Oak Extract     Other reaction(s): Unknown  . Sulfa Antibiotics Itching    DISCHARGE MEDICATIONS:   Allergies as of 02/16/2021      Reactions   Bee Venom Anaphylaxis   Poison Oak Extract    Other reaction(s): Unknown   Sulfa Antibiotics Itching      Medication List    TAKE these medications   albuterol 1.25 MG/3ML nebulizer solution Commonly known as: ACCUNEB Take 1.25 mg by nebulization 3 (three) times daily as needed for wheezing or shortness of breath.   albuterol 108 (90 Base) MCG/ACT inhaler Commonly known as: VENTOLIN HFA Inhale 2 puffs into the lungs every 4 (four) hours as needed.   ALPRAZolam 0.5 MG tablet Commonly known as: XANAX Take 1 tablet (0.5 mg total) by mouth 2 (two) times daily. What changed:  how much to take when to take this   azithromycin 500 MG tablet Commonly known as: ZITHROMAX Take 1 tablet (500 mg total) by mouth daily. Start taking on: February 17, 2021   Breo Ellipta 100-25 MCG/INH Aepb Generic drug: fluticasone furoate-vilanterol Inhale 1 puff into the lungs daily.   EPINEPHrine 0.3 mg/0.3 mL Soaj injection Commonly known as: EPI-PEN Inject 0.3 mg into the muscle as needed.   FLUoxetine 40 MG capsule Commonly known as: PROZAC Take 40 mg by mouth daily.   gabapentin 400 MG capsule Commonly known as: NEURONTIN Take 400 mg by mouth 4 (four) times daily.   ibuprofen 200 MG tablet Commonly known as: ADVIL Take 600 mg by mouth every 6 (six) hours as needed for headache or mild pain.   Klor-Con M20 20 MEQ tablet Generic drug: potassium chloride SA Take 20 mEq by mouth  daily.   levothyroxine 88 MCG tablet Commonly known as: SYNTHROID Take 88 mcg by mouth daily before breakfast.   naproxen 500 MG tablet Commonly known as: NAPROSYN Take 500 mg by mouth 2 (two) times daily as needed.   nicotine 14 mg/24hr  patch Commonly known as: NICODERM CQ - dosed in mg/24 hours Place 14 mg onto the skin daily.   omeprazole 40 MG capsule Commonly known as: PRILOSEC Take 40 mg by mouth daily.   predniSONE 10 MG tablet Commonly known as: DELTASONE Take 50 mg daily--taper by 10 mg daily and stop Start taking on: February 17, 2021   QUEtiapine 200 MG tablet Commonly known as: SEROQUEL Take 1 tablet (200 mg total) by mouth at bedtime.   Spiriva HandiHaler 18 MCG inhalation capsule Generic drug: tiotropium Place 1 capsule into inhaler and inhale daily.   tizanidine 2 MG capsule Commonly known as: ZANAFLEX Take 2 mg by mouth every 8 (eight) hours as needed for muscle spasms.            Durable Medical Equipment  (From admission, onward)         Start     Ordered   02/15/21 1039  For home use only DME oxygen  Once       Question Answer Comment  Length of Need Lifetime   Mode or (Route) Nasal cannula   Liters per Minute 3   Frequency Continuous (stationary and portable oxygen unit needed)   Oxygen conserving device Yes   Oxygen delivery system Gas      02/15/21 1038          If you experience worsening of your admission symptoms, develop shortness of breath, life threatening emergency, suicidal or homicidal thoughts you must seek medical attention immediately by calling 911 or calling your MD immediately  if symptoms less severe.  You Must read complete instructions/literature along with all the possible adverse reactions/side effects for all the Medicines you take and that have been prescribed to you. Take any new Medicines after you have completely understood and accept all the possible adverse reactions/side effects.   Please note  You were cared for by a hospitalist during your hospital stay. If you have any questions about your discharge medications or the care you received while you were in the hospital after you are discharged, you can call the unit and asked to speak with the  hospitalist on call if the hospitalist that took care of you is not available. Once you are discharged, your primary care physician will handle any further medical issues. Please note that NO REFILLS for any discharge medications will be authorized once you are discharged, as it is imperative that you return to your primary care physician (or establish a relationship with a primary care physician if you do not have one) for your aftercare needs so that they can reassess your need for medications and monitor your lab values. Today   SUBJECTIVE   Doing well  VITAL SIGNS:  Blood pressure (!) 127/112, pulse 100, temperature 97.9 F (36.6 C), resp. rate (!) 22, height 5\' 4"  (1.626 m), weight 45.5 kg, SpO2 90 %.  I/O:    Intake/Output Summary (Last 24 hours) at 02/16/2021 1043 Last data filed at 02/16/2021 1017 Gross per 24 hour  Intake 960 ml  Output --  Net 960 ml    PHYSICAL EXAMINATION:   GENERAL:  65 y.o.-year-old patient lying in the bed with no acute distress. Thin, friale LUNGS distant breath sounds  bilaterally, no wheezing, rales, rhonchi. No use of accessory muscles of respiration.  CARDIOVASCULAR: S1, S2 normal. No murmurs, rubs, or gallops.  ABDOMEN: Soft, nontender, nondistended. Bowel sounds present. No organomegaly or mass.  EXTREMITIES: No cyanosis, clubbing or edema b/l.    NEUROLOGIC:nonfocal PSYCHIATRIC:  patient is alert and oriented x 3.  SKIN: No obvious rash, lesion, or ulcer.  DATA REVIEW:   CBC  Recent Labs  Lab 02/14/21 0436  WBC 9.1  HGB 14.7  HCT 43.4  PLT 210    Chemistries  Recent Labs  Lab 02/14/21 0436  NA 137  K 3.7  CL 104  CO2 24  GLUCOSE 169*  BUN 10  CREATININE 0.57  CALCIUM 7.8*    Microbiology Results   Recent Results (from the past 240 hour(s))  Resp Panel by RT-PCR (Flu A&B, Covid) Nasopharyngeal Swab     Status: None   Collection Time: 02/13/21  8:50 PM   Specimen: Nasopharyngeal Swab; Nasopharyngeal(NP) swabs in vial  transport medium  Result Value Ref Range Status   SARS Coronavirus 2 by RT PCR NEGATIVE NEGATIVE Final    Comment: (NOTE) SARS-CoV-2 target nucleic acids are NOT DETECTED.  The SARS-CoV-2 RNA is generally detectable in upper respiratory specimens during the acute phase of infection. The lowest concentration of SARS-CoV-2 viral copies this assay can detect is 138 copies/mL. A negative result does not preclude SARS-Cov-2 infection and should not be used as the sole basis for treatment or other patient management decisions. A negative result may occur with  improper specimen collection/handling, submission of specimen other than nasopharyngeal swab, presence of viral mutation(s) within the areas targeted by this assay, and inadequate number of viral copies(<138 copies/mL). A negative result must be combined with clinical observations, patient history, and epidemiological information. The expected result is Negative.  Fact Sheet for Patients:  BloggerCourse.com  Fact Sheet for Healthcare Providers:  SeriousBroker.it  This test is no t yet approved or cleared by the [place] FDA and  has been authorized for detection and/or diagnosis of SARS-CoV-2 by FDA under an Emergency Use Authorization (EUA). This EUA will remain  in effect (meaning this test can be used) for the duration of the COVID-19 declaration under Section 564(b)(1) of the Act, 21 U.S.C.section 360bbb-3(b)(1), unless the authorization is terminated  or revoked sooner.       Influenza A by PCR NEGATIVE NEGATIVE Final   Influenza B by PCR NEGATIVE NEGATIVE Final    Comment: (NOTE) The Xpert Xpress SARS-CoV-2/FLU/RSV plus assay is intended as an aid in the diagnosis of influenza from Nasopharyngeal swab specimens and should not be used as a sole basis for treatment. Nasal washings and aspirates are unacceptable for Xpert Xpress SARS-CoV-2/FLU/RSV testing.  Fact  Sheet for Patients: BloggerCourse.com  Fact Sheet for Healthcare Providers: SeriousBroker.it  This test is not yet approved or cleared by the [place] FDA and has been authorized for detection and/or diagnosis of SARS-CoV-2 by FDA under an Emergency Use Authorization (EUA). This EUA will remain in effect (meaning this test can be used) for the duration of the COVID-19 declaration under Section 564(b)(1) of the Act, 21 U.S.C. section 360bbb-3(b)(1), unless the authorization is terminated or revoked.  Performed at [facility], [address] [place], [place] [address]     RADIOLOGY:  No results found.   CODE STATUS:     Code Status Orders  (From admission, onward)         Start     Ordered  02/13/21 2315  Full code  Continuous        02/13/21 2316        Code Status History    Date Active Date Inactive Code Status Order ID Comments User Context   06/06/2016 0437 06/07/2016 1521 Full Code [id number]  Arnaldo Natal, MD Inpatient   Advance Care Planning Activity       TOTAL TIME TAKING CARE OF THIS PATIENT: *40* minutes.    Enedina Finner M.D  [facility]    CC: Primary care physician; [facility]

## 2021-02-16 NOTE — Discharge Instructions (Signed)
Chronic Obstructive Pulmonary Disease Exacerbation Chronic obstructive pulmonary disease (COPD) is a long-term (chronic) lung problem. In COPD, the flow of air from the lungs is limited. COPD exacerbations are times that breathing gets worse and you need more than your normal treatment. Without treatment, they can be life threatening. If they happen often, your lungs can become more damaged. If your COPD gets worse, your doctor may treat you with:  Medicines.  Oxygen.  Different ways to clear your airway, such as using a mask. Follow these instructions at home: Medicines  Take over-the-counter and prescription medicines only as told by your doctor.  If you take an antibiotic or steroid medicine, do not stop taking the medicine even if you start to feel better.  Keep up with shots (vaccinations) as told by your doctor. Be sure to get a yearly (annual) flu shot. Lifestyle  Do not smoke. If you need help quitting, ask your doctor.  Eat healthy foods.  Exercise regularly.  Get plenty of sleep.  Avoid tobacco smoke and other things that can bother your lungs.  Wash your hands often with soap and water. This will help keep you from getting an infection. If you cannot use soap and water, use hand sanitizer.  During flu season, avoid areas that are crowded with people. General instructions  Drink enough fluid to keep your pee (urine) clear or pale yellow. Do not do this if your doctor has told you not to.  Use a cool mist machine (vaporizer).  If you use oxygen or a machine that turns medicine into a mist (nebulizer), continue to use it as told.  Follow all instructions for rehabilitation. These are steps you can take to make your body work better.  Keep all follow-up visits as told by your doctor. This is important. Contact a doctor if:  Your COPD symptoms get worse than normal. Get help right away if:  You are short of breath and it gets worse.  You have trouble  talking.  You have chest pain.  You cough up blood.  You have a fever.  You keep throwing up (vomiting).  You feel weak or you pass out (faint).  You feel confused.  You are not able to sleep because of your symptoms.  You are not able to do daily activities. Summary  COPD exacerbations are times that breathing gets worse and you need more treatment than normal.  COPD exacerbations can be very serious and may cause your lungs to become more damaged.  Do not smoke. If you need help quitting, ask your doctor.  Stay up-to-date on your shots. Get a flu shot every year. This information is not intended to replace advice given to you by your health care provider. Make sure you discuss any questions you have with your health care provider. Document Revised: 08/13/2017 Document Reviewed: 10/05/2016 Elsevier Patient Education  2021 Elsevier Inc.    Use your oxygen as instructed

## 2021-02-16 NOTE — Plan of Care (Signed)
°  Problem: Education: °Goal: Knowledge of General Education information will improve °Description: Including pain rating scale, medication(s)/side effects and non-pharmacologic comfort measures °Outcome: Adequate for Discharge °  °Problem: Clinical Measurements: °Goal: Ability to maintain clinical measurements within normal limits will improve °Outcome: Adequate for Discharge °Goal: Will remain free from infection °Outcome: Adequate for Discharge °Goal: Diagnostic test results will improve °Outcome: Adequate for Discharge °Goal: Respiratory complications will improve °Outcome: Adequate for Discharge °Goal: Cardiovascular complication will be avoided °Outcome: Adequate for Discharge °  °

## 2021-02-16 NOTE — Progress Notes (Signed)
Patient discharged home. Son to transport patient home. IV removed. Instructions given to patient, verbalized understanding.

## 2021-04-22 ENCOUNTER — Other Ambulatory Visit: Payer: Self-pay

## 2021-04-22 ENCOUNTER — Inpatient Hospital Stay
Admission: EM | Admit: 2021-04-22 | Discharge: 2021-04-26 | DRG: 871 | Disposition: A | Discharge status: Home Health | Payer: Medicare Other | Attending: Internal Medicine | Admitting: Internal Medicine

## 2021-04-22 ENCOUNTER — Emergency Department: Payer: Medicare Other

## 2021-04-22 ENCOUNTER — Encounter: Payer: Self-pay | Admitting: Emergency Medicine

## 2021-04-22 DIAGNOSIS — J439 Emphysema, unspecified: Secondary | ICD-10-CM | POA: Diagnosis present

## 2021-04-22 DIAGNOSIS — Z91030 Bee allergy status: Secondary | ICD-10-CM

## 2021-04-22 DIAGNOSIS — J9610 Chronic respiratory failure, unspecified whether with hypoxia or hypercapnia: Secondary | ICD-10-CM | POA: Diagnosis present

## 2021-04-22 DIAGNOSIS — E039 Hypothyroidism, unspecified: Secondary | ICD-10-CM | POA: Diagnosis present

## 2021-04-22 DIAGNOSIS — E032 Hypothyroidism due to medicaments and other exogenous substances: Secondary | ICD-10-CM | POA: Diagnosis present

## 2021-04-22 DIAGNOSIS — Z79899 Other long term (current) drug therapy: Secondary | ICD-10-CM

## 2021-04-22 DIAGNOSIS — Z20822 Contact with and (suspected) exposure to covid-19: Secondary | ICD-10-CM | POA: Diagnosis present

## 2021-04-22 DIAGNOSIS — R0602 Shortness of breath: Secondary | ICD-10-CM | POA: Diagnosis present

## 2021-04-22 DIAGNOSIS — G43909 Migraine, unspecified, not intractable, without status migrainosus: Secondary | ICD-10-CM | POA: Diagnosis present

## 2021-04-22 DIAGNOSIS — Z79890 Hormone replacement therapy (postmenopausal): Secondary | ICD-10-CM

## 2021-04-22 DIAGNOSIS — I10 Essential (primary) hypertension: Secondary | ICD-10-CM | POA: Diagnosis present

## 2021-04-22 DIAGNOSIS — A419 Sepsis, unspecified organism: Secondary | ICD-10-CM | POA: Diagnosis present

## 2021-04-22 DIAGNOSIS — Z8616 Personal history of COVID-19: Secondary | ICD-10-CM

## 2021-04-22 DIAGNOSIS — J129 Viral pneumonia, unspecified: Secondary | ICD-10-CM | POA: Diagnosis present

## 2021-04-22 DIAGNOSIS — F319 Bipolar disorder, unspecified: Secondary | ICD-10-CM | POA: Diagnosis present

## 2021-04-22 DIAGNOSIS — K219 Gastro-esophageal reflux disease without esophagitis: Secondary | ICD-10-CM | POA: Diagnosis present

## 2021-04-22 DIAGNOSIS — M545 Low back pain, unspecified: Secondary | ICD-10-CM | POA: Diagnosis present

## 2021-04-22 DIAGNOSIS — Z7951 Long term (current) use of inhaled steroids: Secondary | ICD-10-CM | POA: Diagnosis not present

## 2021-04-22 DIAGNOSIS — J441 Chronic obstructive pulmonary disease with (acute) exacerbation: Secondary | ICD-10-CM

## 2021-04-22 DIAGNOSIS — Z681 Body mass index (BMI) 19 or less, adult: Secondary | ICD-10-CM | POA: Diagnosis not present

## 2021-04-22 DIAGNOSIS — E43 Unspecified severe protein-calorie malnutrition: Secondary | ICD-10-CM | POA: Insufficient documentation

## 2021-04-22 DIAGNOSIS — F17210 Nicotine dependence, cigarettes, uncomplicated: Secondary | ICD-10-CM | POA: Diagnosis present

## 2021-04-22 DIAGNOSIS — A4189 Other specified sepsis: Secondary | ICD-10-CM | POA: Diagnosis present

## 2021-04-22 DIAGNOSIS — Z66 Do not resuscitate: Secondary | ICD-10-CM | POA: Diagnosis present

## 2021-04-22 DIAGNOSIS — J9811 Atelectasis: Secondary | ICD-10-CM | POA: Diagnosis present

## 2021-04-22 DIAGNOSIS — Z8249 Family history of ischemic heart disease and other diseases of the circulatory system: Secondary | ICD-10-CM

## 2021-04-22 DIAGNOSIS — Z91048 Other nonmedicinal substance allergy status: Secondary | ICD-10-CM | POA: Diagnosis not present

## 2021-04-22 DIAGNOSIS — J189 Pneumonia, unspecified organism: Secondary | ICD-10-CM | POA: Diagnosis present

## 2021-04-22 DIAGNOSIS — Z882 Allergy status to sulfonamides status: Secondary | ICD-10-CM | POA: Diagnosis not present

## 2021-04-22 DIAGNOSIS — Z9981 Dependence on supplemental oxygen: Secondary | ICD-10-CM | POA: Diagnosis not present

## 2021-04-22 HISTORY — DX: Hypothyroidism, unspecified: E03.9

## 2021-04-22 HISTORY — DX: Bipolar disorder, unspecified: F31.9

## 2021-04-22 HISTORY — DX: Essential (primary) hypertension: I10

## 2021-04-22 LAB — CBC WITH DIFFERENTIAL/PLATELET
Abs Immature Granulocytes: 0.11 10*3/uL — ABNORMAL HIGH (ref 0.00–0.07)
Basophils Absolute: 0.1 10*3/uL (ref 0.0–0.1)
Basophils Relative: 1 %
Eosinophils Absolute: 0.1 10*3/uL (ref 0.0–0.5)
Eosinophils Relative: 1 %
HCT: 43.8 % (ref 36.0–46.0)
Hemoglobin: 14.6 g/dL (ref 12.0–15.0)
Immature Granulocytes: 1 %
Lymphocytes Relative: 12 %
Lymphs Abs: 1.7 10*3/uL (ref 0.7–4.0)
MCH: 32.4 pg (ref 26.0–34.0)
MCHC: 33.3 g/dL (ref 30.0–36.0)
MCV: 97.1 fL (ref 80.0–100.0)
Monocytes Absolute: 1.1 10*3/uL — ABNORMAL HIGH (ref 0.1–1.0)
Monocytes Relative: 7 %
Neutro Abs: 11.8 10*3/uL — ABNORMAL HIGH (ref 1.7–7.7)
Neutrophils Relative %: 78 %
Platelets: 337 10*3/uL (ref 150–400)
RBC: 4.51 MIL/uL (ref 3.87–5.11)
RDW: 14.8 % (ref 11.5–15.5)
WBC: 14.9 10*3/uL — ABNORMAL HIGH (ref 4.0–10.5)
nRBC: 0 % (ref 0.0–0.2)

## 2021-04-22 LAB — URINALYSIS, COMPLETE (UACMP) WITH MICROSCOPIC
Bilirubin Urine: NEGATIVE
Glucose, UA: NEGATIVE mg/dL
Hgb urine dipstick: NEGATIVE
Ketones, ur: NEGATIVE mg/dL
Leukocytes,Ua: NEGATIVE
Nitrite: NEGATIVE
Protein, ur: NEGATIVE mg/dL
Specific Gravity, Urine: 1.01 (ref 1.005–1.030)
pH: 6 (ref 5.0–8.0)

## 2021-04-22 LAB — COMPREHENSIVE METABOLIC PANEL
ALT: 18 U/L (ref 0–44)
AST: 21 U/L (ref 15–41)
Albumin: 3.5 g/dL (ref 3.5–5.0)
Alkaline Phosphatase: 67 U/L (ref 38–126)
Anion gap: 10 (ref 5–15)
BUN: 11 mg/dL (ref 8–23)
CO2: 27 mmol/L (ref 22–32)
Calcium: 9.2 mg/dL (ref 8.9–10.3)
Chloride: 103 mmol/L (ref 98–111)
Creatinine, Ser: 0.8 mg/dL (ref 0.44–1.00)
GFR, Estimated: >60 mL/min (ref >60)
Glucose, Bld: 103 mg/dL — ABNORMAL HIGH (ref 70–99)
Potassium: 4.1 mmol/L (ref 3.5–5.1)
Sodium: 140 mmol/L (ref 135–145)
Total Bilirubin: 1.2 mg/dL (ref 0.3–1.2)
Total Protein: 6.6 g/dL (ref 6.5–8.1)

## 2021-04-22 LAB — PROTIME-INR
INR: 0.9 (ref 0.8–1.2)
Prothrombin Time: 12 seconds (ref 11.4–15.2)

## 2021-04-22 LAB — TROPONIN I (HIGH SENSITIVITY)
Troponin I (High Sensitivity): 5 ng/L (ref <18)
Troponin I (High Sensitivity): 6 ng/L (ref <18)

## 2021-04-22 LAB — RESP PANEL BY RT-PCR (FLU A&B, COVID) ARPGX2
Influenza A by PCR: NEGATIVE
Influenza B by PCR: NEGATIVE
SARS Coronavirus 2 by RT PCR: NEGATIVE

## 2021-04-22 LAB — BLOOD GAS, VENOUS
Acid-Base Excess: 3.8 mmol/L — ABNORMAL HIGH (ref 0.0–2.0)
Bicarbonate: 29.7 mmol/L — ABNORMAL HIGH (ref 20.0–28.0)
O2 Saturation: 87.9 %
Patient temperature: 37
pCO2, Ven: 49 mmHg (ref 44.0–60.0)
pH, Ven: 7.39 (ref 7.250–7.430)
pO2, Ven: 55 mmHg — ABNORMAL HIGH (ref 32.0–45.0)

## 2021-04-22 LAB — PROCALCITONIN: Procalcitonin: <0.1 ng/mL

## 2021-04-22 LAB — APTT: aPTT: 26 seconds (ref 24–36)

## 2021-04-22 LAB — LACTIC ACID, PLASMA: Lactic Acid, Venous: 1.1 mmol/L (ref 0.5–1.9)

## 2021-04-22 LAB — MAGNESIUM: Magnesium: 1.8 mg/dL (ref 1.7–2.4)

## 2021-04-22 MED ORDER — GUAIFENESIN 100 MG/5ML PO SOLN
5.0000 mL | ORAL | Status: DC | PRN
  Filled 2021-04-22: qty 5

## 2021-04-22 MED ORDER — IPRATROPIUM-ALBUTEROL 0.5-2.5 (3) MG/3ML IN SOLN
9.0000 mL | Freq: Once | RESPIRATORY_TRACT | Status: AC
  Administered 2021-04-22: 9 mL via RESPIRATORY_TRACT
  Filled 2021-04-22: qty 3

## 2021-04-22 MED ORDER — ALBUTEROL SULFATE (2.5 MG/3ML) 0.083% IN NEBU: 2.5000 mg | INHALATION_SOLUTION | RESPIRATORY_TRACT | Status: DC | PRN

## 2021-04-22 MED ORDER — PREDNISONE 20 MG PO TABS
40.0000 mg | ORAL_TABLET | Freq: Every day | ORAL | Status: AC
Start: 2021-04-23 — End: 2021-04-26
  Administered 2021-04-23 – 2021-04-26 (×4): 40 mg via ORAL
  Filled 2021-04-22 (×4): qty 2

## 2021-04-22 MED ORDER — TIZANIDINE HCL 4 MG PO TABS
4.0000 mg | ORAL_TABLET | Freq: Three times a day (TID) | ORAL | Status: DC
  Administered 2021-04-22 – 2021-04-26 (×12): 4 mg via ORAL
  Filled 2021-04-22 (×14): qty 1

## 2021-04-22 MED ORDER — IPRATROPIUM-ALBUTEROL 0.5-2.5 (3) MG/3ML IN SOLN
3.0000 mL | Freq: Four times a day (QID) | RESPIRATORY_TRACT | Status: DC
  Administered 2021-04-22 – 2021-04-26 (×15): 3 mL via RESPIRATORY_TRACT
  Filled 2021-04-22 (×16): qty 3

## 2021-04-22 MED ORDER — SODIUM CHLORIDE 0.9 % IV SOLN: INTRAVENOUS | Status: DC

## 2021-04-22 MED ORDER — LEVOTHYROXINE SODIUM 88 MCG PO TABS
88.0000 ug | ORAL_TABLET | Freq: Every day | ORAL | Status: DC
  Administered 2021-04-23 – 2021-04-26 (×4): 88 ug via ORAL
  Filled 2021-04-22 (×4): qty 1

## 2021-04-22 MED ORDER — FLUOXETINE HCL 20 MG PO CAPS
40.0000 mg | ORAL_CAPSULE | Freq: Every day | ORAL | Status: DC
  Filled 2021-04-22: qty 2

## 2021-04-22 MED ORDER — SODIUM CHLORIDE 0.9 % IV SOLN
1.0000 g | Freq: Once | INTRAVENOUS | Status: AC
  Administered 2021-04-22: 1 g via INTRAVENOUS
  Filled 2021-04-22: qty 10

## 2021-04-22 MED ORDER — ALPRAZOLAM 0.5 MG PO TABS
1.0000 mg | ORAL_TABLET | Freq: Four times a day (QID) | ORAL | Status: DC
  Administered 2021-04-22 – 2021-04-26 (×16): 1 mg via ORAL
  Filled 2021-04-22 (×16): qty 2

## 2021-04-22 MED ORDER — ENOXAPARIN SODIUM 40 MG/0.4ML IJ SOSY
40.0000 mg | PREFILLED_SYRINGE | INTRAMUSCULAR | Status: DC
  Administered 2021-04-22 – 2021-04-25 (×4): 40 mg via SUBCUTANEOUS
  Filled 2021-04-22 (×4): qty 0.4

## 2021-04-22 MED ORDER — SODIUM CHLORIDE 0.9 % IV SOLN
2.0000 g | INTRAVENOUS | Status: DC
  Administered 2021-04-22: 2 g via INTRAVENOUS
  Filled 2021-04-22: qty 2
  Filled 2021-04-22: qty 20

## 2021-04-22 MED ORDER — PANTOPRAZOLE SODIUM 40 MG PO TBEC
40.0000 mg | DELAYED_RELEASE_TABLET | Freq: Every day | ORAL | Status: DC
  Administered 2021-04-22 – 2021-04-26 (×5): 40 mg via ORAL
  Filled 2021-04-22 (×5): qty 1

## 2021-04-22 MED ORDER — LACTATED RINGERS IV BOLUS
500.0000 mL | Freq: Once | INTRAVENOUS | Status: AC
  Administered 2021-04-22: 500 mL via INTRAVENOUS

## 2021-04-22 MED ORDER — METHYLPREDNISOLONE SODIUM SUCC 125 MG IJ SOLR
125.0000 mg | Freq: Once | INTRAMUSCULAR | Status: AC
  Administered 2021-04-22: 125 mg via INTRAVENOUS
  Filled 2021-04-22: qty 2

## 2021-04-22 MED ORDER — QUETIAPINE FUMARATE 200 MG PO TABS
400.0000 mg | ORAL_TABLET | Freq: Two times a day (BID) | ORAL | Status: DC
  Administered 2021-04-22 – 2021-04-26 (×8): 400 mg via ORAL
  Filled 2021-04-22 (×10): qty 2

## 2021-04-22 MED ORDER — ACETAMINOPHEN 500 MG PO TABS: 1000.0000 mg | ORAL_TABLET | Freq: Once | ORAL | Status: DC

## 2021-04-22 MED ORDER — METHYLPREDNISOLONE SODIUM SUCC 40 MG IJ SOLR
40.0000 mg | Freq: Two times a day (BID) | INTRAMUSCULAR | Status: AC
  Administered 2021-04-22 – 2021-04-23 (×2): 40 mg via INTRAVENOUS
  Filled 2021-04-22 (×2): qty 1

## 2021-04-22 MED ORDER — SODIUM CHLORIDE 0.9 % IV SOLN
500.0000 mg | INTRAVENOUS | Status: AC
  Administered 2021-04-23 – 2021-04-26 (×4): 500 mg via INTRAVENOUS
  Filled 2021-04-22 (×4): qty 500

## 2021-04-22 MED ORDER — LACTATED RINGERS IV BOLUS
1000.0000 mL | Freq: Once | INTRAVENOUS | Status: AC
  Administered 2021-04-22: 1000 mL via INTRAVENOUS

## 2021-04-22 MED ORDER — TRAMADOL HCL 50 MG PO TABS
50.0000 mg | ORAL_TABLET | Freq: Once | ORAL | Status: AC
Start: 2021-04-22 — End: 2021-04-22
  Administered 2021-04-22: 50 mg via ORAL
  Filled 2021-04-22: qty 1

## 2021-04-22 MED ORDER — FLUTICASONE FUROATE-VILANTEROL 100-25 MCG/INH IN AEPB
1.0000 | INHALATION_SPRAY | Freq: Every day | RESPIRATORY_TRACT | Status: DC
  Administered 2021-04-23 – 2021-04-26 (×4): 1 via RESPIRATORY_TRACT
  Filled 2021-04-22: qty 28

## 2021-04-22 MED ORDER — FLUOXETINE HCL 20 MG PO CAPS
40.0000 mg | ORAL_CAPSULE | Freq: Every day | ORAL | Status: DC
  Administered 2021-04-23 – 2021-04-26 (×4): 40 mg via ORAL
  Filled 2021-04-22 (×4): qty 2

## 2021-04-22 MED ORDER — NICOTINE 14 MG/24HR TD PT24
14.0000 mg | MEDICATED_PATCH | Freq: Every day | TRANSDERMAL | Status: DC
  Administered 2021-04-22 – 2021-04-26 (×5): 14 mg via TRANSDERMAL
  Filled 2021-04-22 (×6): qty 1

## 2021-04-22 MED ORDER — SODIUM CHLORIDE 0.9 % IV SOLN
500.0000 mg | Freq: Once | INTRAVENOUS | Status: AC
  Administered 2021-04-22: 500 mg via INTRAVENOUS
  Filled 2021-04-22 (×2): qty 500

## 2021-04-22 NOTE — Sepsis Progress Note (Signed)
ELink monitoring sepsis protocol 

## 2021-04-22 NOTE — Progress Notes (Signed)
OT Cancellation Note  Patient Details Name: Doris Jenkins MRN: [id number] DOB: 07-22-1956   Cancelled Treatment:    Reason Eval/Treat Not Completed: OT screened, no needs identified, will sign off. OT order received and chart reviewed. Per conversation with PT, pt is near baseline functioning with no skilled acute OT needs identified. Pt denies concerns at this time, will sign off. Please re-consult if new needs arise.   Kathie Dike, M.S. OTR/L  04/22/21, 2:21 PM  ascom [phone]

## 2021-04-22 NOTE — ED Notes (Signed)
Patient aware of need for urine specimen collection.  

## 2021-04-22 NOTE — Progress Notes (Signed)
CODE SEPSIS - PHARMACY COMMUNICATION  **Broad Spectrum Antibiotics should be administered within 1 hour of Sepsis diagnosis**  Time Code Sepsis Called/Page Received: 6389  Antibiotics Ordered: Ceftriaxone + azithromycin  Time of 1st antibiotic administration: 1014  Additional action taken by pharmacy: N/A  Tressie Ellis 04/22/2021  10:00 AM

## 2021-04-22 NOTE — H&P (Signed)
History and Physical    Doris Jenkins OXB:[id number] DOB: [birth year] DOA: 04/22/2021  PCP: Pcp, No   Patient coming from: Home  I have personally briefly reviewed patient's old medical records in [facility]  Chief Complaint: Shortness of breath  HPI: Doris Jenkins is a 65 y.o. female with medical history significant for bipolar disorder, COPD with chronic respiratory failure on 3 L of oxygen, hypertension, low back pain, GERD, lithium induced hypothyroidism, migraine, nicotine dependence who presents to the ER for evaluation of worsening shortness of breath associated with a cough productive of gray phlegm, chills and myalgias. Patient states that her symptoms started about a week ago has progressively worsened.  She arrived emergency room on 4 L of oxygen via nasal cannula and was noted to be dyspneic with conversational dyspnea. She has chest pain which is mostly pleuritic but denies having any orthopnea, no lower extremity swelling, no abdominal pain, no changes in her bowel habits, no urinary symptoms, no dizziness, no lightheadedness, no headache, no palpitations or diaphoresis. She states that she is vaccinated against the COVID-19 virus. She was recently hospitalized in June and was discharged home on oxygen at 3 L per nasal cannula. Venous blood gas pH 7.39/49/55/29.7/87.9 Sodium 140, potassium 4.1, chloride 103, bicarb 27 glucose 103, BUN 11, creatinine 0.80, calcium 9.2, magnesium 1.8, alkaline phosphatase 67, albumin 3.1 AST 21, ALT 18, total protein 6.6, troponin 6, lactic acid 1.1, white count 14.9, hemoglobin 14.6, hematocrit 43.8, MCV 97.1, RDW 14.8, platelet count 337, PT 12, INR 0.9 Respiratory viral panel is negative Chest x-ray reviewed by me shows emphysema bibasilar infiltrates consistent with multifocal pneumonia.   ED Course: Patient is a 65 year old female with a history of COPD with chronic respiratory failure who presents to the ER for evaluation of worsening  shortness of breath for a week associated with a cough productive of gray phlegm, chills and myalgias. Chest x-ray is consistent with multifocal pneumonia She received IV antibiotics (Rocephin and Zithromax)  in the ER and will be admitted to the hospital for further evaluation.   Review of Systems: As per HPI otherwise all other systems reviewed and negative.    Past Medical History:  Diagnosis Date   Bipolar disorder (HCC)    Hypertension    Hypothyroidism     Past Surgical History:  Procedure Laterality Date   ABDOMINAL HYSTERECTOMY     BREAST BIOPSY N/A 80s    benign   OOPHORECTOMY     TOTAL THYROIDECTOMY       reports that she has been smoking cigarettes. She has never used smokeless tobacco. She reports that she does not drink alcohol and does not use drugs.  Allergies  Allergen Reactions   Bee Venom Anaphylaxis   Sulfa Antibiotics Itching, Rash and Shortness Of Breath    Redness    Poison Oak Extract     Other reaction(s): Unknown    Family History  Problem Relation Age of Onset   Cancer Mother    Heart attack Father       Prior to Admission medications   Medication Sig Start Date End Date Taking? Authorizing Provider  albuterol (ACCUNEB) 1.25 MG/3ML nebulizer solution Take 1.25 mg by nebulization 3 (three) times daily as needed for wheezing or shortness of breath.   Yes [provider]  albuterol (VENTOLIN HFA) 108 (90 Base) MCG/ACT inhaler Inhale 2 puffs into the lungs every 4 (four) hours as needed. 01/25/21  Yes [provider]  ALPRAZolam Prudy Feeler)  1 MG tablet Take 1 mg by mouth 4 (four) times daily. 03/31/21  Yes [provider]  BREO ELLIPTA 100-25 MCG/INH AEPB Inhale 1 puff into the lungs daily. 12/25/20  Yes [provider]  FLUoxetine (PROZAC) 40 MG capsule Take 40 mg by mouth daily.   Yes [provider]  levothyroxine (SYNTHROID) 88 MCG tablet Take 88 mcg by mouth daily before breakfast.   Yes [provider]  omeprazole (PRILOSEC) 40 MG capsule Take 40 mg by mouth daily. 01/02/21  Yes [provider]  predniSONE (DELTASONE) 10 MG tablet Take 50 mg daily--taper by 10 mg daily and stop 02/17/21  Yes Enedina FinnerPatel, Sona, MD  QUEtiapine (SEROQUEL) 400 MG tablet Take 400 mg by mouth 2 (two) times daily. 04/20/21  Yes [provider]  tiotropium (SPIRIVA HANDIHALER) 18 MCG inhalation capsule Place 1 capsule into inhaler and inhale daily. 06/21/15  Yes [provider]  tiZANidine (ZANAFLEX) 4 MG tablet Take 4 mg by mouth 3 (three) times daily. 02/17/21  Yes [provider]  EPINEPHrine 0.3 mg/0.3 mL IJ SOAJ injection Inject 0.3 mg into the muscle as needed. 06/21/15   [provider]  gabapentin (NEURONTIN) 300 MG capsule Take 300 mg by mouth 4 (four) times daily. Patient not taking: No sig reported 12/20/20   [provider]  gabapentin (NEURONTIN) 400 MG capsule Take 400 mg by mouth 4 (four) times daily. Patient not taking: No sig reported 12/20/20   [provider]  ibuprofen (ADVIL,MOTRIN) 200 MG tablet Take 600 mg by mouth every 6 (six) hours as needed for headache or mild pain.    [provider]  KLOR-CON M20 20 MEQ tablet Take 20 mEq by mouth daily. Patient not taking: No sig reported 10/19/20   [provider]  naproxen (NAPROSYN) 500 MG tablet Take 500 mg by mouth 2 (two) times daily as needed. 02/04/21   [provider]  nicotine (NICODERM CQ - DOSED IN MG/24 HOURS) 14 mg/24hr patch Place 14 mg onto the skin daily. 11/19/20   [provider]  pregabalin (LYRICA) 50 MG capsule Take 50 mg by mouth 2 (two) times daily. Patient not taking: Reported on 04/22/2021 04/02/21   [provider]  QUEtiapine (SEROQUEL) 200 MG tablet Take 1 tablet (200 mg total) by mouth at bedtime. Patient not taking: Reported on 04/22/2021 06/07/16   Wyatt HasteHower, David K, MD    Physical Exam: Vitals:   04/22/21 0827 04/22/21 0944  04/22/21 1200 04/22/21 1311  BP:  117/88 117/84 112/79  Pulse:  (!) 104 (!) 101 98  Resp:  (!) 22 20 18   Temp:  98.1 F (36.7 C) 98.4 F (36.9 C) 98.5 F (36.9 C)  TempSrc:  Oral  Oral  SpO2: 90% 98% 99% 98%  Weight: 45.5 kg     Height: 5\' 4"  (1.626 m)        Vitals:   04/22/21 0827 04/22/21 0944 04/22/21 1200 04/22/21 1311  BP:  117/88 117/84 112/79  Pulse:  (!) 104 (!) 101 98  Resp:  (!) 22 20 18   Temp:  98.1 F (36.7 C) 98.4 F (36.9 C) 98.5 F (36.9 C)  TempSrc:  Oral  Oral  SpO2: 90% 98% 99% 98%  Weight: 45.5 kg     Height: 5\' 4"  (1.626 m)         Constitutional: Alert and oriented x 3 . Not in any apparent distress.  Thin and frail HEENT:      Head:  Normocephalic and atraumatic.         Eyes: PERLA, EOMI, Conjunctivae are normal. Sclera is non-icteric.       Mouth/Throat: Mucous membranes are moist.       Neck: Supple with no signs of meningismus. Cardiovascular: Tachycardic. No murmurs, gallops, or rubs. 2+ symmetrical distal pulses are present . No JVD. No LE edema Respiratory: Tachypneic.scattered rhonchi right lung field, scattered wheezes , no crackles    Gastrointestinal: Soft, non tender, and non distended with positive bowel sounds.  Genitourinary: No CVA tenderness. Musculoskeletal: Nontender with normal range of motion in all extremities. No cyanosis, or erythema of extremities. Neurologic:  Face is symmetric. Moving all extremities. No gross focal neurologic deficits . Skin: Skin is warm, dry.  No rash or ulcers Psychiatric: Mood and affect are normal    Labs on Admission: I have personally reviewed following labs and imaging studies  CBC: Recent Labs  Lab 04/22/21 0831  WBC 14.9*  NEUTROABS 11.8*  HGB 14.6  HCT 43.8  MCV 97.1  PLT 337   Basic Metabolic Panel: Recent Labs  Lab 04/22/21 0831  NA 140  K 4.1  CL 103  CO2 27  GLUCOSE 103*  BUN 11  CREATININE 0.80  CALCIUM 9.2  MG 1.8   GFR: Estimated Creatinine Clearance: 51.7  mL/min (by C-G formula based on SCr of 0.8 mg/dL). Liver Function Tests: Recent Labs  Lab 04/22/21 0831  AST 21  ALT 18  ALKPHOS 67  BILITOT 1.2  PROT 6.6  ALBUMIN 3.5   No results for input(s): LIPASE, AMYLASE in the last 168 hours. No results for input(s): AMMONIA in the last 168 hours. Coagulation Profile: Recent Labs  Lab 04/22/21 0831  INR 0.9   Cardiac Enzymes: No results for input(s): CKTOTAL, CKMB, CKMBINDEX, TROPONINI in the last 168 hours. BNP (last 3 results) No results for input(s): PROBNP in the last 8760 hours. HbA1C: No results for input(s): HGBA1C in the last 72 hours. CBG: No results for input(s): GLUCAP in the last 168 hours. Lipid Profile: No results for input(s): CHOL, HDL, LDLCALC, TRIG, CHOLHDL, LDLDIRECT in the last 72 hours. Thyroid Function Tests: No results for input(s): TSH, T4TOTAL, FREET4, T3FREE, THYROIDAB in the last 72 hours. Anemia Panel: No results for input(s): VITAMINB12, FOLATE, FERRITIN, TIBC, IRON, RETICCTPCT in the last 72 hours. Urine analysis:    Component Value Date/Time   COLORURINE YELLOW (A) 06/06/2016 1212   APPEARANCEUR CLEAR (A) 06/06/2016 1212   LABSPEC 1.010 06/06/2016 1212   PHURINE 7.0 06/06/2016 1212   GLUCOSEU NEGATIVE 06/06/2016 1212   HGBUR NEGATIVE 06/06/2016 1212   BILIRUBINUR NEGATIVE 06/06/2016 1212   KETONESUR NEGATIVE 06/06/2016 1212   PROTEINUR NEGATIVE 06/06/2016 1212   NITRITE NEGATIVE 06/06/2016 1212   LEUKOCYTESUR NEGATIVE 06/06/2016 1212    Radiological Exams on Admission: DG Chest Port 1 View  Result Date: 04/22/2021 CLINICAL DATA:  Worsening shortness of breath for 1 week, questionable sepsis, history COPD, hypertension, smoker EXAM: PORTABLE CHEST 1 VIEW COMPARISON:  Portable exam 0843 hours compared to 02/13/2021 FINDINGS: Normal heart size, mediastinal contours, and pulmonary vascularity. Emphysematous and bronchitic changes consistent with COPD. Scattered mild chronic interstitial prominence  with new patchy bibasilar infiltrates slightly greater on RIGHT consistent with multifocal pneumonia. No pleural effusion or pneumothorax. Bones demineralized. Surgical clips LEFT breast and LEFT axilla. IMPRESSION: COPD changes with bibasilar infiltrates consistent with multifocal pneumonia. Emphysema (ICD10-J43.9). Electronically Signed   By: Ulyses Southward M.D.   On: 04/22/2021 09:12  Assessment/Plan Principal Problem:   Sepsis (HCC) Active Problems:   Bipolar disorder (HCC)   Essential hypertension   Hypothyroidism   COPD with acute exacerbation (HCC)   CAP (community acquired pneumonia)     Sepsis from community-acquired pneumonia As evidenced by tachycardia, tachypnea, leukocytosis and multifocal infiltrates on chest x-ray Patient presents for evaluation of worsening shortness of breath, chills and myalgias as well as a cough productive of gray phlegm chest x-ray shows findings suggestive of multifocal infiltrates. We will treat patient empirically with Rocephin and Zithromax Follow-up results of blood cultures     COPD with acute exacerbation Patient has a history of COPD with chronic respiratory failure and is on 3 L of oxygen continuous She presents for evaluation of worsening shortness of breath from her baseline associated with productive cough and wheezing Will place patient on systemic and inhaled steroids Place patient on scheduled and as needed bronchodilator therapy Continue oxygen supplementation to maintain pulse oximetry greater than 92%    Hypothyroidism Continue Synthroid    Bipolar disorder Continue alprazolam, fluoxetine and Seroquel    Nicotine dependence Smoking cessation was discussed with patient in detail She states that she has cut back on the number of cigarettes that she smokes Continue nicotine transdermal patch  DVT prophylaxis: Lovenox  Code Status: DNR  Family Communication: Plan of care was discussed with her in detail and she  lists her son Austin Pongratz as her healthcare power of attorney.  CODE STATUS was discussed and she is a DNR.  She verbalizes understanding and agrees with the treatment plan. Disposition Plan: Back to previous home environment Consults called: none  Status: At the time of admission, it appears that the appropriate admission status for this patient is inpatient. This is judged to be reasonable and necessary in order to provide the required intensity of service to ensure the patient's safety given the presenting symptoms, physical exam findings, and initial radiographic and laboratory data in the context of their comorbid conditions. Patient requires inpatient status due to high intensity of service, high risk for deterioration and high frequency of surveillance required.    Lucile Shutters MD [facility]     04/22/2021, 1:17 PM

## 2021-04-22 NOTE — ED Triage Notes (Signed)
C/o worsening SOB x 1 week.  Hx COPD.  Wears 3l/ [place] home oxygen.   Arrives on 4l/ .  Dyspneic, tachypnea.  Skin warm and dry.  Unable to speak in complete sentences.

## 2021-04-22 NOTE — Progress Notes (Signed)
PT Cancellation Note  Patient Details Name: Doris Jenkins MRN: [id number] DOB: 09/16/1955   Cancelled Treatment:    Reason Eval/Treat Not Completed: Other (comment). Consult received and chart reviewed. Pt seated in bed eating lunch. Pt reports she is at baseline level and is indep with all mobility. Currently on baseline O2 (3L) with no SOB symptoms. Reports she feels fine and no services needed. Will dc current order. Please re-order if needed.   Lillar Bianca 04/22/2021, 1:51 PM Elizabeth Palau, PT, DPT [phone]

## 2021-04-22 NOTE — Plan of Care (Signed)

## 2021-04-22 NOTE — ED Provider Notes (Signed)
[facility] Emergency Department Provider Note  ____________________________________________   Event Date/Time   First MD Initiated Contact with Patient 04/22/21 [phone]     (approximate)  I have reviewed the triage vital signs and the nursing notes.   HISTORY  Chief Complaint Shortness of Breath   HPI Doris Jenkins is a 66 y.o. female past medical history of chronic hypoxia gastric failure on 3 L at baseline secondary to COPD and ongoing tobacco abuse, bipolar disorder, and hypothyroidism as well as HTN who presents for assessment approximately 1 week of worsening cough and shortness of breath in the setting approximately 1 month of some increased shortness of breath.  She think she definitely got worse over the last week.  She also complains of chest tightness that she states is on and off over the last 6 months with number little worse the last couple of days.  She endorses chills but has not measured any fevers.  No new headache, earache, sore throat, nausea, vomiting, diarrhea, dysuria, rash, back pain or extremity pain.  No recent falls or injuries.  States he has been using her inhalers at home with his have not significantly helped.         History reviewed. No pertinent past medical history.  Patient Active Problem List   Diagnosis Date Noted   Acute hypoxemic respiratory failure (HCC) 02/14/2021   Acute respiratory failure with hypoxia (HCC) 02/13/2021   COPD with acute exacerbation (HCC) 02/13/2021   Hypokalemia 02/13/2021   Encephalopathy 06/06/2016   Acute delirium 06/06/2016   Bipolar disorder (HCC) 06/06/2016   Neuropathic pain 05/09/2015   Essential hypertension 06/05/2014   Back pain, chronic 07/27/2013   COPD (chronic obstructive pulmonary disease) (HCC) 07/27/2013   Hypothyroidism 07/27/2013    Past Surgical History:  Procedure Laterality Date   ABDOMINAL HYSTERECTOMY     BREAST BIOPSY N/A 80s    benign   OOPHORECTOMY     TOTAL  THYROIDECTOMY      Prior to Admission medications   Medication Sig Start Date End Date Taking? Authorizing Provider  albuterol (ACCUNEB) 1.25 MG/3ML nebulizer solution Take 1.25 mg by nebulization 3 (three) times daily as needed for wheezing or shortness of breath.    [provider]  albuterol (VENTOLIN HFA) 108 (90 Base) MCG/ACT inhaler Inhale 2 puffs into the lungs every 4 (four) hours as needed. 01/25/21   [provider]  ALPRAZolam Prudy Feeler) 0.5 MG tablet Take 1 tablet (0.5 mg total) by mouth 2 (two) times daily. Patient taking differently: Take 1 mg by mouth in the morning, at noon, in the evening, and at bedtime. 06/07/16   Hower, Cletis Athens, MD  azithromycin (ZITHROMAX) 500 MG tablet Take 1 tablet (500 mg total) by mouth daily. 02/17/21   Enedina Finner, MD  BREO ELLIPTA 100-25 MCG/INH AEPB Inhale 1 puff into the lungs daily. 12/25/20   [provider]  EPINEPHrine 0.3 mg/0.3 mL IJ SOAJ injection Inject 0.3 mg into the muscle as needed. 06/21/15   [provider]  FLUoxetine (PROZAC) 40 MG capsule Take 40 mg by mouth daily.    [provider]  gabapentin (NEURONTIN) 400 MG capsule Take 400 mg by mouth 4 (four) times daily. 12/20/20   [provider]  ibuprofen (ADVIL,MOTRIN) 200 MG tablet Take 600 mg by mouth every 6 (six) hours as needed for headache or mild pain.    [provider]  KLOR-CON M20 20 MEQ tablet Take 20 mEq by mouth daily.  10/19/20   [provider]  levothyroxine (SYNTHROID) 88 MCG tablet Take 88 mcg by mouth daily before breakfast.    [provider]  naproxen (NAPROSYN) 500 MG tablet Take 500 mg by mouth 2 (two) times daily as needed. 02/04/21   [provider]  nicotine (NICODERM CQ - DOSED IN MG/24 HOURS) 14 mg/24hr patch Place 14 mg onto the skin daily. 11/19/20   [provider]  omeprazole (PRILOSEC) 40 MG capsule Take 40 mg by mouth daily. 01/02/21   [provider]  predniSONE  (DELTASONE) 10 MG tablet Take 50 mg daily--taper by 10 mg daily and stop 02/17/21   Enedina Finner, MD  QUEtiapine (SEROQUEL) 200 MG tablet Take 1 tablet (200 mg total) by mouth at bedtime. 06/07/16   Hower, Cletis [place], MD  tiotropium (SPIRIVA HANDIHALER) 18 MCG inhalation capsule Place 1 capsule into inhaler and inhale daily. 06/21/15   [provider]  tizanidine (ZANAFLEX) 2 MG capsule Take 2 mg by mouth every 8 (eight) hours as needed for muscle spasms.    [provider]    Allergies Bee venom, Poison oak extract, and Sulfa antibiotics  Family History  Problem Relation Age of Onset   Cancer Mother    Heart attack Father     Social History Social History   Tobacco Use   Smoking status: Every Day    Types: Cigarettes   Smokeless tobacco: Never  Substance Use Topics   Alcohol use: No   Drug use: No    Review of Systems  Review of Systems  Constitutional:  Positive for chills. Negative for fever.  HENT:  Negative for sore throat.   Eyes:  Negative for pain.  Respiratory:  Positive for cough, sputum production, shortness of breath and wheezing. Negative for hemoptysis and stridor.   Cardiovascular:  Positive for chest pain.  Gastrointestinal:  Negative for vomiting.  Genitourinary:  Negative for dysuria.  Musculoskeletal:  Negative for myalgias.  Skin:  Negative for rash.  Neurological:  Negative for seizures, loss of consciousness and headaches.  Psychiatric/Behavioral:  Negative for suicidal ideas.   All other systems reviewed and are negative.   ____________________________________________   PHYSICAL EXAM:  VITAL SIGNS: ED Triage Vitals  Enc Vitals Group     BP 04/22/21 0824 135/90     Pulse Rate 04/22/21 0824 (!) 119     Resp 04/22/21 0824 (!) 32     Temp --      Temp src --      SpO2 04/22/21 0824 93 %     Weight 04/22/21 0827 100 lb 5 oz (45.5 kg)     Height 04/22/21 0827 5\' 4"  (1.626 m)     Head Circumference --      Peak Flow --      Pain  Score 04/22/21 0827 0     Pain Loc --      Pain Edu? --      Excl. in GC? --    Vitals:   04/22/21 0827 04/22/21 0944  BP:  117/88  Pulse:  (!) 104  Resp:  (!) 22  Temp:  98.1 F (36.7 C)  SpO2: 90% 98%   Physical Exam Vitals and nursing note reviewed.  Constitutional:      General: She is in acute distress.     Appearance: She is well-developed. She is ill-appearing.  HENT:     Head: Normocephalic and atraumatic.     Right Ear: External ear normal.  Left Ear: External ear normal.     Nose: Nose normal.  Eyes:     Conjunctiva/sclera: Conjunctivae normal.  Cardiovascular:     Rate and Rhythm: Regular rhythm. Tachycardia present.     Pulses: Normal pulses.     Heart sounds: No murmur heard. Pulmonary:     Effort: Tachypnea and respiratory distress present.     Breath sounds: Wheezing present.  Abdominal:     Palpations: Abdomen is soft.     Tenderness: There is no abdominal tenderness. There is no right CVA tenderness or left CVA tenderness.  Musculoskeletal:     Cervical back: Neck supple.     Right lower leg: No edema.     Left lower leg: No edema.  Skin:    General: Skin is warm and dry.     Capillary Refill: Capillary refill takes less than 2 seconds.  Neurological:     Mental Status: She is alert and oriented to person, place, and time.  Psychiatric:        Mood and Affect: Mood normal.    ____________________________________________   LABS (all labs ordered are listed, but only abnormal results are displayed)  Labs Reviewed  COMPREHENSIVE METABOLIC PANEL - Abnormal; Notable for the following components:      Result Value   Glucose, Bld 103 (*)    All other components within normal limits  CBC WITH DIFFERENTIAL/PLATELET - Abnormal; Notable for the following components:   WBC 14.9 (*)    Neutro Abs 11.8 (*)    Monocytes Absolute 1.1 (*)    Abs Immature Granulocytes 0.11 (*)    All other components within normal limits  CULTURE, BLOOD (SINGLE)   RESP PANEL BY RT-PCR (FLU A&B, COVID) ARPGX2  PROTIME-INR  APTT  MAGNESIUM  PROCALCITONIN  LACTIC ACID, PLASMA  LACTIC ACID, PLASMA  URINALYSIS, COMPLETE (UACMP) WITH MICROSCOPIC  BLOOD GAS, VENOUS  TROPONIN I (HIGH SENSITIVITY)  TROPONIN I (HIGH SENSITIVITY)   ____________________________________________  EKG  Sinus rhythm with a ventricular rate of 99, unremarkable intervals, right axis deviation without clearance of acute ischemia or significant arrhythmia ____________________________________________  RADIOLOGY  ED MD interpretation: Chest x-ray shows bilateral opacities consistent with multifocal pneumonia.  No overt edema, large effusion, pneumothorax or other clear acute intrathoracic process.  Official radiology report(s): DG Chest Port 1 View  Result Date: 04/22/2021 CLINICAL DATA:  Worsening shortness of breath for 1 week, questionable sepsis, history COPD, hypertension, smoker EXAM: PORTABLE CHEST 1 VIEW COMPARISON:  Portable exam 0843 hours compared to 02/13/2021 FINDINGS: Normal heart size, mediastinal contours, and pulmonary vascularity. Emphysematous and bronchitic changes consistent with COPD. Scattered mild chronic interstitial prominence with new patchy bibasilar infiltrates slightly greater on RIGHT consistent with multifocal pneumonia. No pleural effusion or pneumothorax. Bones demineralized. Surgical clips LEFT breast and LEFT axilla. IMPRESSION: COPD changes with bibasilar infiltrates consistent with multifocal pneumonia. Emphysema (ICD10-J43.9). Electronically Signed   By: Ulyses SouthwardMark  Boles M.D.   On: 04/22/2021 09:12    ____________________________________________   PROCEDURES  Procedure(s) performed (including Critical Care):  .Critical Care  Date/Time: 04/22/2021 9:55 AM Performed by: Gilles ChiquitoSmith, Kayode Petion P, MD Authorized by: Gilles ChiquitoSmith, Ellice Boultinghouse P, MD   Critical care provider statement:    Critical care time (minutes):  45   Critical care was necessary to treat or  prevent imminent or life-threatening deterioration of the following conditions:  Sepsis   Critical care was time spent personally by me on the following activities:  Discussions with consultants, evaluation of patient's response to treatment,  examination of patient, ordering and performing treatments and interventions, ordering and review of laboratory studies, ordering and review of radiographic studies, pulse oximetry, re-evaluation of patient's condition, obtaining history from patient or surrogate and review of old charts   ____________________________________________   INITIAL IMPRESSION / ASSESSMENT AND PLAN / ED COURSE      Patient presents with above-stated history exam persistent worsening cough and shortness of breath as well as chills and some chest tightness in the last week.  It seems that this is an acute on subacute progression she states she has been feeling more short of breath in the last month.  On arrival she is tachypneic in the 30s, tachycardic in the 110s, with otherwise stable vital signs on 3 L.  SPO2 is greater 95% on 3 L.  She is quite dyspneic and tachypneic with accessory muscle use and wheezing throughout all lung fields on arrival.  She does also appear slightly dehydrated. Primary differential includes acute COPD exacerbation, pneumonia, anemia, PE, arrhythmia, metabolic derangements bronchitis.  Given wheezing and tachypnea tachycardia and concerned about COPD exacerbation.  Patient started on duo nebs and Solu-Medrol on arrival.  X-ray is no evidence of pneumothorax but does show multifocal opacities consistent with multifocal pneumonia.  Patient does not appear volume overloaded and no significant edema seen on chest x-ray to suggest acute heart failure exacerbation.  ECG and nonelevated troponin are not suggestive of ACS or myocarditis.  CMP shows no significant electrolyte or metabolic derangements.  Magnesium is within normal limits.  CBC remarkable for  leukocytosis with WBC count of 14.9 without acute anemia.  Given leukocytosis, tachycardia and tachypnea findings on chest x-ray and history concerning for pneumonia and concern for sepsis.  Patient given IV fluid and broad-spectrum antibiotics.  Blood cultures ordered.  We will also order lactic acid.  I will plan to admit to medicine service for further evaluation and management.       ____________________________________________   FINAL CLINICAL IMPRESSION(S) / ED DIAGNOSES  Final diagnoses:  Sepsis, due to unspecified organism, unspecified whether acute organ dysfunction present (HCC)  SOB (shortness of breath)  COPD exacerbation (HCC)  Community acquired pneumonia, unspecified laterality    Medications  cefTRIAXone (ROCEPHIN) 1 g in sodium chloride 0.9 % 100 mL IVPB (1 g Intravenous New Bag/Given 04/22/21 1014)  azithromycin (ZITHROMAX) 500 mg in sodium chloride 0.9 % 250 mL IVPB (has no administration in time range)  acetaminophen (TYLENOL) tablet 1,000 mg (1,000 mg Oral Not Given 04/22/21 1019)  ipratropium-albuterol (DUONEB) 0.5-2.5 (3) MG/3ML nebulizer solution 9 mL (9 mLs Nebulization Given 04/22/21 1011)  lactated ringers bolus 500 mL (500 mLs Intravenous New Bag/Given 04/22/21 1019)  methylPREDNISolone sodium succinate (SOLU-MEDROL) 125 mg/2 mL injection 125 mg (125 mg Intravenous Given 04/22/21 1019)  lactated ringers bolus 1,000 mL (1,000 mLs Intravenous New Bag/Given 04/22/21 1013)     ED Discharge Orders     None        Note:  This document was prepared using Dragon voice recognition software and may include unintentional dictation errors.    Gilles Chiquito, MD 04/22/21 1034

## 2021-04-23 DIAGNOSIS — E032 Hypothyroidism due to medicaments and other exogenous substances: Secondary | ICD-10-CM

## 2021-04-23 DIAGNOSIS — F319 Bipolar disorder, unspecified: Secondary | ICD-10-CM

## 2021-04-23 DIAGNOSIS — E43 Unspecified severe protein-calorie malnutrition: Secondary | ICD-10-CM

## 2021-04-23 LAB — BASIC METABOLIC PANEL
Anion gap: 6 (ref 5–15)
BUN: 13 mg/dL (ref 8–23)
CO2: 27 mmol/L (ref 22–32)
Calcium: 8.2 mg/dL — ABNORMAL LOW (ref 8.9–10.3)
Chloride: 106 mmol/L (ref 98–111)
Creatinine, Ser: 0.67 mg/dL (ref 0.44–1.00)
GFR, Estimated: >60 mL/min (ref >60)
Glucose, Bld: 131 mg/dL — ABNORMAL HIGH (ref 70–99)
Potassium: 4.1 mmol/L (ref 3.5–5.1)
Sodium: 139 mmol/L (ref 135–145)

## 2021-04-23 LAB — RESPIRATORY PANEL BY PCR

## 2021-04-23 LAB — CBC
HCT: 37 % (ref 36.0–46.0)
Hemoglobin: 12.3 g/dL (ref 12.0–15.0)
MCH: 32.2 pg (ref 26.0–34.0)
MCHC: 33.2 g/dL (ref 30.0–36.0)
MCV: 96.9 fL (ref 80.0–100.0)
Platelets: 321 10*3/uL (ref 150–400)
RBC: 3.82 MIL/uL — ABNORMAL LOW (ref 3.87–5.11)
RDW: 14.8 % (ref 11.5–15.5)
WBC: 13.2 10*3/uL — ABNORMAL HIGH (ref 4.0–10.5)
nRBC: 0 % (ref 0.0–0.2)

## 2021-04-23 LAB — HIV ANTIBODY (ROUTINE TESTING W REFLEX): HIV Screen 4th Generation wRfx: NONREACTIVE

## 2021-04-23 MED ORDER — ASCORBIC ACID 500 MG PO TABS
250.0000 mg | ORAL_TABLET | Freq: Two times a day (BID) | ORAL | Status: DC
  Administered 2021-04-23 – 2021-04-26 (×6): 250 mg via ORAL
  Filled 2021-04-23 (×6): qty 1

## 2021-04-23 MED ORDER — TRAMADOL HCL 50 MG PO TABS
50.0000 mg | ORAL_TABLET | Freq: Two times a day (BID) | ORAL | Status: DC | PRN
Start: 2021-04-23 — End: 2021-04-26
  Administered 2021-04-23 – 2021-04-24 (×2): 50 mg via ORAL
  Filled 2021-04-23 (×2): qty 1

## 2021-04-23 MED ORDER — BOOST / RESOURCE BREEZE PO LIQD CUSTOM
1.0000 | Freq: Three times a day (TID) | ORAL | Status: DC
  Administered 2021-04-23 – 2021-04-24 (×2): 1 via ORAL

## 2021-04-23 MED ORDER — ACETAMINOPHEN 325 MG PO TABS: 650.0000 mg | ORAL_TABLET | Freq: Four times a day (QID) | ORAL | Status: DC | PRN

## 2021-04-23 MED ORDER — ADULT MULTIVITAMIN W/MINERALS CH
1.0000 | ORAL_TABLET | Freq: Every day | ORAL | Status: DC
  Administered 2021-04-24 – 2021-04-26 (×3): 1 via ORAL
  Filled 2021-04-23 (×3): qty 1

## 2021-04-23 NOTE — Progress Notes (Signed)
PROGRESS NOTE    Doris Jenkins  EYE:[id number] DOB: 1955-12-14 DOA: 04/22/2021 PCP: Pcp, No   Brief Narrative: Taken from H&P. Doris Jenkins is a 65 y.o. female with medical history significant for bipolar disorder, COPD with chronic respiratory failure on 3 L of oxygen, hypertension, low back pain, GERD, lithium induced hypothyroidism, migraine, nicotine dependence who presents to the ER for evaluation of worsening shortness of breath associated with a cough productive of gray phlegm, chills and myalgias. Symptoms started approximately a week ago and progressively worsened.  She was also having pleuritic chest pain.  She was vaccinated against COVID-19.  Recent hospitalization in June. COVID-19 and influenza negative. Chest x-ray with emphysema and bibasilar infiltrates consistent with multifocal pneumonia. Initially started on ceftriaxone and Zithromax for concern of CAP.  Procalcitonin negative, most likely viral.  MRSA PCR and respiratory viral panel pending.  Subjective: Seems improving when seen this morning.  Still little tachypneic.  Denies any chest pain.  A friend who also provide most of the care was at bedside.  Per patient she occasionally cooks for herself but realizes mostly on others for meals.  Skipping meals a lot.  Asking for Meals on Wheels help to improve her nutrition.  Assessment & Plan:   Principal Problem:   Sepsis ([place]) Active Problems:   Bipolar disorder ([place])   Essential hypertension   Hypothyroidism   COPD with acute exacerbation ([place])   CAP (community acquired pneumonia)   Protein-calorie malnutrition, severe  Sepsis secondary to multifocal pneumonia.  Most likely viral as procalcitonin is negative. Initially met sepsis criteria with tachycardia, tachypnea, leukocytosis and multifocal infiltrates on chest x-ray.  Currently saturating well on her baseline oxygen requirement of 2 to 3 L.  Blood cultures negative so far. -Check MRSA swab -Respiratory viral  panel -Discontinue ceftriaxone -Continue with Zithromax  COPD exacerbation.  No wheezing today.  Most likely secondary to viral upper respiratory infection/viral pneumonia. -Continue with supportive therapy. -Continue with bronchodilators -Continue with steroids -Continue with supplemental oxygen to keep the saturation above 90%.  Hypothyroidism. -Continue home Synthroid.  History of bipolar disorder.  No acute concern. -Continue home dose of alprazolam, fluoxetine and Seroquel  Severe protein caloric malnutrition. Estimated body mass index is 17.22 kg/m as calculated from the following:   Height as of this encounter: '5\' 4"'  (1.626 m).   Weight as of this encounter: 45.5 kg.  Poor p.o. intake as she was not preparing her meals regularly. -Dietitian consult -TOC consult to see if we can arrange Meals on Wheels  Nicotine dependence.  Counseling was provided. -Continue with nicotine patch  Objective: Vitals:   04/22/21 2118 04/23/21 0353 04/23/21 0721 04/23/21 1514  BP:  115/68  130/80  Pulse:  97  (!) 106  Resp:  16  18  Temp:  98.3 F (36.8 C)  98.1 F (36.7 C)  TempSrc:  Oral  Oral  SpO2: 99% 100% 96% 97%  Weight:      Height:       No intake or output data in the 24 hours ending 04/23/21 1624 Filed Weights   04/22/21 0827  Weight: 45.5 kg    Examination:  General exam: Frail and malnourished lady, appears calm and comfortable  Respiratory system: Clear to auscultation. Respiratory effort normal, mildly decreased breath sound. Cardiovascular system: S1 & S2 heard, RRR.  Gastrointestinal system: Soft, nontender, nondistended, bowel sounds positive. Central nervous system: Alert and oriented. No focal neurological deficits.Symmetric 5 x 5 power. Extremities: No edema,  no cyanosis, pulses intact and symmetrical. Psychiatry: Judgement and insight appear normal.   DVT prophylaxis: Lovenox Code Status: DNR Family Communication: A friend was updated at bedside.   Who is also her caregiver. Disposition Plan:  Status is: Inpatient  Remains inpatient appropriate because:Inpatient level of care appropriate due to severity of illness  Dispo: The patient is from: Home              Anticipated d/c is to: Home              Patient currently is not medically stable to d/c.   Difficult to place patient No              Level of care: Med-Surg  All the records are reviewed and case discussed with Care Management/Social Worker. Management plans discussed with the patient, nursing and they are in agreement.  Consultants:  None  Procedures:  Antimicrobials: Zithromax  Data Reviewed: I have personally reviewed following labs and imaging studies  CBC: Recent Labs  Lab 04/22/21 0831 04/23/21 0405  WBC 14.9* 13.2*  NEUTROABS 11.8*  --   HGB 14.6 12.3  HCT 43.8 37.0  MCV 97.1 96.9  PLT 337 729   Basic Metabolic Panel: Recent Labs  Lab 04/22/21 0831 04/23/21 0405  NA 140 139  K 4.1 4.1  CL 103 106  CO2 27 27  GLUCOSE 103* 131*  BUN 11 13  CREATININE 0.80 0.67  CALCIUM 9.2 8.2*  MG 1.8  --    GFR: Estimated Creatinine Clearance: 51.7 mL/min (by C-G formula based on SCr of 0.67 mg/dL). Liver Function Tests: Recent Labs  Lab 04/22/21 0831  AST 21  ALT 18  ALKPHOS 67  BILITOT 1.2  PROT 6.6  ALBUMIN 3.5   No results for input(s): LIPASE, AMYLASE in the last 168 hours. No results for input(s): AMMONIA in the last 168 hours. Coagulation Profile: Recent Labs  Lab 04/22/21 0831  INR 0.9   Cardiac Enzymes: No results for input(s): CKTOTAL, CKMB, CKMBINDEX, TROPONINI in the last 168 hours. BNP (last 3 results) No results for input(s): PROBNP in the last 8760 hours. HbA1C: No results for input(s): HGBA1C in the last 72 hours. CBG: No results for input(s): GLUCAP in the last 168 hours. Lipid Profile: No results for input(s): CHOL, HDL, LDLCALC, TRIG, CHOLHDL, LDLDIRECT in the last 72 hours. Thyroid Function Tests: No results  for input(s): TSH, T4TOTAL, FREET4, T3FREE, THYROIDAB in the last 72 hours. Anemia Panel: No results for input(s): VITAMINB12, FOLATE, FERRITIN, TIBC, IRON, RETICCTPCT in the last 72 hours. Sepsis Labs: Recent Labs  Lab 04/22/21 0831 04/22/21 1019  PROCALCITON <0.10  --   LATICACIDVEN  --  1.1    Recent Results (from the past 240 hour(s))  Blood culture (routine single)     Status: None (Preliminary result)   Collection Time: 04/22/21  9:45 AM   Specimen: BLOOD LEFT FOREARM  Result Value Ref Range Status   Specimen Description BLOOD LEFT FOREARM  Final   Special Requests BOTTLES DRAWN AEROBIC AND ANAEROBIC  Final   Culture   Final    NO GROWTH < 24 HOURS Performed at [facility], [address], [place], [place] [address]    Report Status PENDING  Incomplete  Resp Panel by RT-PCR (Flu A&B, Covid) Nasopharyngeal Swab     Status: None   Collection Time: 04/22/21  9:45 AM   Specimen: Nasopharyngeal Swab; Nasopharyngeal(NP) swabs in vial transport medium  Result Value Ref Range  Status   SARS Coronavirus 2 by RT PCR NEGATIVE NEGATIVE Final    Comment: (NOTE) SARS-CoV-2 target nucleic acids are NOT DETECTED.  The SARS-CoV-2 RNA is generally detectable in upper respiratory specimens during the acute phase of infection. The lowest concentration of SARS-CoV-2 viral copies this assay can detect is 138 copies/mL. A negative result does not preclude SARS-Cov-2 infection and should not be used as the sole basis for treatment or other patient management decisions. A negative result may occur with  improper specimen collection/handling, submission of specimen other than nasopharyngeal swab, presence of viral mutation(s) within the areas targeted by this assay, and inadequate number of viral copies(<138 copies/mL). A negative result must be combined with clinical observations, patient history, and epidemiological information. The expected result is Negative.  Fact Sheet for  Patients:  EntrepreneurPulse.com.au  Fact Sheet for Healthcare Providers:  IncredibleEmployment.be  This test is no t yet approved or cleared by the [place] FDA and  has been authorized for detection and/or diagnosis of SARS-CoV-2 by FDA under an Emergency Use Authorization (EUA). This EUA will remain  in effect (meaning this test can be used) for the duration of the COVID-19 declaration under Section 564(b)(1) of the Act, 21 U.S.C.section 360bbb-3(b)(1), unless the authorization is terminated  or revoked sooner.       Influenza A by PCR NEGATIVE NEGATIVE Final   Influenza B by PCR NEGATIVE NEGATIVE Final    Comment: (NOTE) The Xpert Xpress SARS-CoV-2/FLU/RSV plus assay is intended as an aid in the diagnosis of influenza from Nasopharyngeal swab specimens and should not be used as a sole basis for treatment. Nasal washings and aspirates are unacceptable for Xpert Xpress SARS-CoV-2/FLU/RSV testing.  Fact Sheet for Patients: EntrepreneurPulse.com.au  Fact Sheet for Healthcare Providers: IncredibleEmployment.be  This test is not yet approved or cleared by the [place] FDA and has been authorized for detection and/or diagnosis of SARS-CoV-2 by FDA under an Emergency Use Authorization (EUA). This EUA will remain in effect (meaning this test can be used) for the duration of the COVID-19 declaration under Section 564(b)(1) of the Act, 21 U.S.C. section 360bbb-3(b)(1), unless the authorization is terminated or revoked.  Performed at [facility], [address] [place], [place] [address]      Radiology Studies: DG Chest [place] 1 View  Result Date: 04/22/2021 CLINICAL DATA:  Worsening shortness of breath for 1 week, questionable sepsis, history COPD, hypertension, smoker EXAM: PORTABLE CHEST 1 VIEW COMPARISON:  Portable exam 0843 hours compared to 02/13/2021 FINDINGS: Normal heart size,  mediastinal contours, and pulmonary vascularity. Emphysematous and bronchitic changes consistent with COPD. Scattered mild chronic interstitial prominence with new patchy bibasilar infiltrates slightly greater on RIGHT consistent with multifocal pneumonia. No pleural effusion or pneumothorax. Bones demineralized. Surgical clips LEFT breast and LEFT axilla. IMPRESSION: COPD changes with bibasilar infiltrates consistent with multifocal pneumonia. Emphysema (ICD10-J43.9). Electronically Signed   By: Lavonia Dana M.D.   On: 04/22/2021 09:12    Scheduled Meds:  acetaminophen  1,000 mg Oral Once   ALPRAZolam  1 mg Oral QID   vitamin C  250 mg Oral BID   enoxaparin (LOVENOX) injection  40 mg Subcutaneous Q24H   feeding supplement  1 Container Oral TID BM   FLUoxetine  40 mg Oral Daily   fluticasone furoate-vilanterol  1 puff Inhalation Daily   ipratropium-albuterol  3 mL Nebulization Q6H   levothyroxine  88 mcg Oral QAC breakfast   [START ON 04/24/2021] multivitamin with minerals  1 tablet Oral  Daily   nicotine  14 mg Transdermal Daily   pantoprazole  40 mg Oral Daily   predniSONE  40 mg Oral Q breakfast   QUEtiapine  400 mg Oral BID   tiZANidine  4 mg Oral TID   Continuous Infusions:  sodium chloride 125 mL/hr at 04/23/21 0726   azithromycin 500 mg (04/23/21 0916)   cefTRIAXone (ROCEPHIN)  IV Stopped (04/22/21 2324)     LOS: 1 day   Time spent: 40 minutes. More than 50% of the time was spent in counseling/coordination of care  Lorella Nimrod, MD [facility]  If 7PM-7AM, please contact night-coverage [email or web address]  04/23/2021, 4:24 PM   This record has been created using Systems analyst. Errors have been sought and corrected,but may not always be located. Such creation errors do not reflect on the standard of care.

## 2021-04-23 NOTE — Progress Notes (Signed)
Initial Nutrition Assessment  DOCUMENTATION CODES:   Severe malnutrition in context of chronic illness  INTERVENTION:   Boost Breeze po TID, each supplement provides 250 kcal and 9 grams of protein  Vital Cuisine TID, each supplement provides 520kcal and 22g of protein.   Magic cup TID with meals, each supplement provides 290 kcal and 9 grams of protein  MVI po daily   Vitamin C 250m po BID   Pt at high refeed risk; recommend monitor potassium, magnesium and phosphorus labs daily until stable  NUTRITION DIAGNOSIS:   Severe Malnutrition related to chronic illness (COPD) as evidenced by severe fat depletion, severe muscle depletion.  GOAL:   Patient will meet greater than or equal to 90% of their needs  MONITOR:   PO intake, Supplement acceptance, Labs, Weight trends, Skin, I & O's  REASON FOR ASSESSMENT:   Consult Assessment of nutrition requirement/status  ASSESSMENT:   65y.o. female with medical history significant for bipolar disorder, COPD with chronic respiratory failure on 3 L of oxygen, hypertension, low back pain, GERD, breast cancer s/p partial mastectomy and XRT, polycythemia, lithium induced hypothyroidism, migraine and nicotine dependence who is admitted with CAP  Met with pt in room today. Pt reports poor appetite and oral intake at baseline. Pt reports that recently, she was put on prednisone and that her oral intake improved but that she did not gain any weight. Pt reports progressive weight loss. Per chart, pt is down 7lbs(7%) since December. Pt reports that her appetite remains poor in hospital. Pt ate a few bites of a sandwich and some chocolate ice cream for lunch today. Pt reports that she does not like Ensure or Boost supplements but that she has been eating a lot of ice cream at home. RD discussed with pt the importance of adequate nutrition needed to preserve lean muscle. Pt would like to try Boost Breeze and Vital Cuisine in hospital. Pt would also  like to have orange Magic Cups. RD will add supplements and vitamins to help pt meet her estimated needs. Pt with ecchymosis, will add vitamin C. Pt is likely at refeed risk. Of note, pt wears dentures.    Medications reviewed and include: lovenox, synthroid, nicotine, protonix, prednisone, azithromycin, ceftriaxone   Labs reviewed: wbc- 13.2(H)  NUTRITION - FOCUSED PHYSICAL EXAM:  Flowsheet Row Most Recent Value  Orbital Region Moderate depletion  Upper Arm Region Severe depletion  Thoracic and Lumbar Region Severe depletion  Buccal Region Moderate depletion  Temple Region Moderate depletion  Clavicle Bone Region Severe depletion  Clavicle and Acromion Bone Region Severe depletion  Scapular Bone Region Severe depletion  Dorsal Hand Severe depletion  Patellar Region Severe depletion  Anterior Thigh Region Severe depletion  Posterior Calf Region Severe depletion  Edema (RD Assessment) None  Hair Reviewed  Eyes Reviewed  Mouth Reviewed  Skin Reviewed  Nails Reviewed   Diet Order:   Diet Order             Diet regular Room service appropriate? Yes; Fluid consistency: Thin  Diet effective now                  EDUCATION NEEDS:   Education needs have been addressed  Skin:  Skin Assessment: Reviewed RN Assessment (ecchymosis)  Last BM:  8/9- type 3  Height:   Ht Readings from Last 1 Encounters:  04/22/21 '5\' 4"'  (1.626 m)    Weight:   Wt Readings from Last 1 Encounters:  04/22/21 45.5 kg  Ideal Body Weight:  54.5 kg  BMI:  Body mass index is 17.22 kg/m.  Estimated Nutritional Needs:   Kcal:  1400-1600kcal/day  Protein:  70-80g/day  Fluid:  1.4-1.6L/day  Koleen Distance MS, RD, LDN Please refer to [facility] for RD and/or RD on-call/weekend/after hours pager

## 2021-04-24 LAB — MRSA NEXT GEN BY PCR, NASAL: MRSA by PCR Next Gen: NOT DETECTED

## 2021-04-24 MED ORDER — LEVALBUTEROL HCL 1.25 MG/0.5ML IN NEBU
1.2500 mg | INHALATION_SOLUTION | Freq: Four times a day (QID) | RESPIRATORY_TRACT | Status: DC | PRN
  Filled 2021-04-24 (×2): qty 0.5

## 2021-04-24 NOTE — Progress Notes (Signed)
   04/24/21 1423  Assess: MEWS Score  Temp 97.7 F (36.5 C)  BP 137/84  Pulse Rate (!) 107  Resp (!) 28  SpO2 98 %  O2 Device Nasal Cannula  O2 Flow Rate (L/min) 3 L/min  Assess: MEWS Score  MEWS Temp 0  MEWS Systolic 0  MEWS Pulse 1  MEWS RR 2  MEWS LOC 0  MEWS Score 3  MEWS Score Color Yellow  Assess: if the MEWS score is Yellow or Red  Were vital signs taken at a resting state? Yes  Focused Assessment Change from prior assessment (see assessment flowsheet)  Does the patient meet 2 or more of the SIRS criteria? No  MEWS guidelines implemented *See Row Information* Yes  Treat  MEWS Interventions Consulted Respiratory Therapy;Other (Comment) (Recheked respirations)  Take Vital Signs  Increase Vital Sign Frequency  Yellow: Q 2hr X 2 then Q 4hr X 2, if remains yellow, continue Q 4hrs  Escalate  MEWS: Escalate Yellow: discuss with charge nurse/RN and consider discussing with provider and RRT  Notify: Charge Nurse/RN  Name of Charge Nurse/RN Notified [place], RN  Date Charge Nurse/RN Notified 04/24/21  Time Charge Nurse/RN Notified 1428  Notify: Provider  Provider Name/Title Dr. Nelson Chimes  Date Provider Notified 04/24/21  Time Provider Notified 1428  Notification Type Page  Notification Reason Change in status  Provider response See new orders (Respiratroy to give patient new breathing treatment)  Date of Provider Response 04/24/21  Time of Provider Response 1426  Document  Patient Outcome Stabilized after interventions  Progress note created (see row info) Yes  Assess: SIRS CRITERIA  SIRS Temperature  0  SIRS Pulse 1  SIRS Respirations  1  SIRS WBC 0  SIRS Score Sum  2

## 2021-04-24 NOTE — TOC Initial Note (Signed)
Transition of Care [facility]) - Initial/Assessment Note    Patient Details  Name: Doris Jenkins MRN: [id number] Date of Birth: 1955/10/13  Transition of Care [facility]) CM/SW Contact:    Chapman Fitch, RN Phone Number: 04/24/2021, 3:07 PM  Clinical Narrative:                  Patient resting upon entering room Friend at bedside PT/OT pending Provided food pantry and meals on wheels left at bedside        Patient Goals and CMS Choice        Expected Discharge Plan and Services                                                Prior Living Arrangements/Services                       Activities of Daily Living Home Assistive Devices/Equipment: Eyeglasses, Oxygen, Dentures (specify type), Nebulizer ADL Screening (condition at time of admission) Patient's cognitive ability adequate to safely complete daily activities?: Yes Is the patient deaf or have difficulty hearing?: No Does the patient have difficulty seeing, even when wearing glasses/contacts?: No Does the patient have difficulty concentrating, remembering, or making decisions?: No Patient able to express need for assistance with ADLs?: Yes Does the patient have difficulty dressing or bathing?: No Independently performs ADLs?: Yes (appropriate for developmental age) Does the patient have difficulty walking or climbing stairs?: No Weakness of Legs: Both Weakness of Arms/Hands: Both  Permission Sought/Granted                  Emotional Assessment              Admission diagnosis:  SOB (shortness of breath) [R06.02] COPD exacerbation (HCC) [J44.1] CAP (community acquired pneumonia) [J18.9] Community acquired pneumonia, unspecified laterality [J18.9] Sepsis, due to unspecified organism, unspecified whether acute organ dysfunction present [facility]) [A41.9] Patient Active Problem List   Diagnosis Date Noted   Protein-calorie malnutrition, severe 04/23/2021   CAP (community acquired pneumonia)  04/22/2021   Sepsis (HCC) 04/22/2021   Acute hypoxemic respiratory failure (HCC) 02/14/2021   Acute respiratory failure with hypoxia (HCC) 02/13/2021   COPD exacerbation (HCC) 02/13/2021   Hypokalemia 02/13/2021   Encephalopathy 06/06/2016   Acute delirium 06/06/2016   Bipolar disorder (HCC) 06/06/2016   Neuropathic pain 05/09/2015   Essential hypertension 06/05/2014   Back pain, chronic 07/27/2013   COPD (chronic obstructive pulmonary disease) (HCC) 07/27/2013   Hypothyroidism 07/27/2013   PCP:  Oneita Hurt, No Pharmacy:   [id number] [place], [place] - [address] [place] [address] Phone: [phone] Fax: [phone]     Social Determinants of Health (SDOH) Interventions    Readmission Risk Interventions No flowsheet data found.

## 2021-04-24 NOTE — Evaluation (Signed)
Occupational Therapy Evaluation Patient Details Name: Doris Jenkins MRN: [id number] DOB: 10/21/1955 Today's Date: 04/24/2021    History of Present Illness Pt is a 65 y/o F with PMH: bipolar, COPD (on 3Lnc at home), HTN and GERD who presented to [facility] d/t wrosening SOB. Pt found to have multifocal PNA.   Clinical Impression   Pt seen for OT eavluation this date in setting of acute hospitalization d/t PNA. Pt reports being INDEP to MOD I at baseline with ADLs including using shower seat to bathe and reports no AD use for fxl mobility at baseline. Pt states she lives alone [facility] with 4 STE. Pt presents this date with significantly decreaed fxl activity tolerance and cardiopulmonary status impeding her ability to safely and efficiently perform ADLs/ADL mobility. On ADL assessment, pt is Able to perform seated UB and LB ADLs wtih SETUP for energy conservation. Requires SBA for ADL transfers d/t low tolerance and some LE weakness. Pt demos very limited tolerance for standing tasks. For 30 seconds of marching in place, pt requires ~1-2 minutes seated rest to recover. Note: her spO2 is stable throughout (>95% even with activity) but her RR is noted to increase to ~35-38 breaths per minute upon rebounding from activity (so immediately upon concluding marching in place for example). Pt returned to bed at end of session. Bed left in chair position to offer more support for breathing surface area. Will continue to follow and anticipate she could benefit from f/u [facility] services to make her home environment more conducive to energy conservation.      Follow Up Recommendations  Home health OT    Equipment Recommendations  3 in 1 bedside commode;Other (comment) (rollator for energy conservation with fxl mobility)    Recommendations for Other Services       Precautions / Restrictions Precautions Precautions: Fall Restrictions Weight Bearing Restrictions: No      Mobility Bed Mobility Overal bed mobility:  Modified Independent             General bed mobility comments: HOB elevated, increased time adn effort.    Transfers Overall transfer level: Needs assistance Equipment used: Rolling walker (2 wheeled) Transfers: Sit to/from Stand Sit to Stand: Supervision         General transfer comment: RW primarily used for energy conservation    Balance Overall balance assessment: Modified Independent                                         ADL either performed or assessed with clinical judgement   ADL Overall ADL's : Needs assistance/impaired                                       General ADL Comments: Able to perform seated UB and LB ADLs wtih SETUP for energy conservation. Requires SBA for ADL transfers d/t low tolerance and some LE weakness.     Vision Baseline Vision/History: Wears glasses Wears Glasses: At all times Patient Visual Report: No change from baseline       Perception     Praxis      Pertinent Vitals/Pain Pain Assessment: No/denies pain     Hand Dominance Right   Extremity/Trunk Assessment Upper Extremity Assessment Upper Extremity Assessment: Generalized weakness;Overall WFL for tasks assessed (ROM WFL, MMT grossly 4-/5)  Lower Extremity Assessment Lower Extremity Assessment: Generalized weakness;Overall WFL for tasks assessed (ROM WFL, MMT grossly 4-/5)   Cervical / Trunk Assessment Cervical / Trunk Assessment: Kyphotic   Communication Communication Communication: No difficulties   Cognition Arousal/Alertness: Awake/alert Behavior During Therapy: WFL for tasks assessed/performed Overall Cognitive Status: Within Functional Limits for tasks assessed                                     General Comments  pt with G balance sitting and standing, RW used for standing and fxl mobility primarily for energy conservation. Pt on 3Lnc throughout, her O2 sustains >95% throughout session, but her RR is noted  to increase to ~35-38 upon rebound from activity (in the 1-2 minutes she is recovering from activity such as marching in place, versus increasing with actual performance of the marching).    Exercises Other Exercises Other Exercises: OT ed re: EC streagies including cross-leg technique to conserve energy with LB dressing to decrease compression on diaphragm/lungs. Pt with good understanding, will require f/u.   Shoulder Instructions      Home Living Family/patient expects to be discharged to:: Private residence Living Arrangements: Alone Available Help at Discharge: Family;Available PRN/intermittently Type of Home: House Home Access: Stairs to enter [employer] of Steps: 4 Entrance Stairs-Rails: Right Home Layout: One level               Home Equipment: Walker - 2 wheels;Shower seat          Prior Functioning/Environment Level of Independence: Independent        Comments: pt endorses 2 recent falls, no AD for fxl mobility at baseline, has 2WW that she does not use. INDEP with ADLs (MOD I with bathing-uses shower seat)        OT Problem List: Decreased strength;Decreased activity tolerance;Cardiopulmonary status limiting activity;Decreased knowledge of use of DME or AE      OT Treatment/Interventions: Self-care/ADL training;Therapeutic activities;Therapeutic exercise    OT Goals(Current goals can be found in the care plan section) Acute Rehab OT Goals Patient Stated Goal: to get my breathing better so I can spend time with my grand child OT Goal Formulation: With patient Time For Goal Achievement: 05/08/21 Potential to Achieve Goals: Good ADL Goals Pt Will Perform Grooming: with modified independence;sitting (with implementation of EC strategies with no verbal cues including modifications and use of AE PRN-) Pt Will Perform Lower Body Bathing: with supervision;sit to/from stand (with AE PRN for EC) Pt/caregiver will Perform Home Exercise Program:   (postural exercise to improve surface area for quality of breathing) Additional ADL Goal #1: Pt will verbalize/demo implementation of 2-3 EC strategies into ADL routines with no cues.  OT Frequency: Min 1X/week   Barriers to D/C:            Co-evaluation              AM-PAC OT "6 Clicks" Daily Activity     Outcome Measure Help from another person eating meals?: None Help from another person taking care of personal grooming?: None Help from another person toileting, which includes using toliet, bedpan, or urinal?: A Little Help from another person bathing (including washing, rinsing, drying)?: A Little Help from another person to put on and taking off regular upper body clothing?: None Help from another person to put on and taking off regular lower body clothing?: A Little 6 Click Score: 21  End of Session Equipment Utilized During Treatment: Gait belt;Rolling walker;Oxygen Nurse Communication: Mobility status  Activity Tolerance: Patient tolerated treatment well Patient left: in bed;with call bell/phone within reach;Other (comment) (in chair position)  OT Visit Diagnosis: Unsteadiness on feet (R26.81);Muscle weakness (generalized) (M62.81);History of falling (Z91.81)                Time: 7209-4709 OT Time Calculation (min): 34 min Charges:  OT General Charges $OT Visit: 1 Visit OT Evaluation $OT Eval Moderate Complexity: 1 Mod OT Treatments $Self Care/Home Management : 8-22 mins $Therapeutic Activity: 8-22 mins  Rejeana Brock, MS, OTR/L ascom [phone] 04/24/21, 7:21 PM

## 2021-04-24 NOTE — Progress Notes (Signed)
PROGRESS NOTE    Doris Jenkins  KAJ:[id number] DOB: 1955-12-21 DOA: 04/22/2021 PCP: Pcp, No   Brief Narrative: Taken from H&P. Doris Jenkins is a 65 y.o. female with medical history significant for bipolar disorder, COPD with chronic respiratory failure on 3 L of oxygen, hypertension, low back pain, GERD, lithium induced hypothyroidism, migraine, nicotine dependence who presents to the ER for evaluation of worsening shortness of breath associated with a cough productive of gray phlegm, chills and myalgias. Symptoms started approximately a week ago and progressively worsened.  She was also having pleuritic chest pain.  She was vaccinated against COVID-19.  Recent hospitalization in June. COVID-19 and influenza negative. Chest x-ray with emphysema and bibasilar infiltrates consistent with multifocal pneumonia. Initially started on ceftriaxone and Zithromax for concern of CAP.  Procalcitonin negative, most likely viral.  MRSA PCR and respiratory viral panel negative.  Becoming little more tachypneic and tachycardic.  Generalized aches and pain.  Subjective: Patient was not feeling well when seen today.  Stating that she is having extreme fatigue and malaise. Becoming short of breath while talking although saturating well on her baseline oxygen requirement. She was asking for home health services after discharge as lives alone.  A friend at bedside.  Assessment & Plan:   Principal Problem:   Sepsis ([place]) Active Problems:   Bipolar disorder ([place])   Essential hypertension   Hypothyroidism   COPD exacerbation ([place])   CAP (community acquired pneumonia)   Protein-calorie malnutrition, severe  Sepsis secondary to multifocal pneumonia.  Most likely viral as procalcitonin is negative. Initially met sepsis criteria with tachycardia, tachypnea, leukocytosis and multifocal infiltrates on chest x-ray.  Currently saturating well on her baseline oxygen requirement of 2 to 3 L.  Blood cultures negative  so far.  MRSA swab and respiratory viral panel were negative.  Ceftriaxone was discontinued. -Continue with Zithromax -Continue with supportive care  COPD exacerbation.  No wheezing today.  Most likely secondary to viral upper respiratory infection/viral pneumonia.  Saturating well on her baseline oxygen requirement. -Continue with supportive therapy. -Continue with bronchodilators -Continue with steroids -Continue with supplemental oxygen to keep the saturation above 90%.  Weakness/fatigue.  Per patient she was experiencing worsening fatigue and weakness for a while now.  Unable to complete her ADLs.  Dependent on other person to bring some meals otherwise she will not eat much. -PT/OT evaluation -TOC to look for resources and provide her regarding getting meals. -She will get benefit from home health services  Hypothyroidism. -Continue home Synthroid.  History of bipolar disorder.  No acute concern. -Continue home dose of alprazolam, fluoxetine and Seroquel  Severe protein caloric malnutrition. Estimated body mass index is 17.22 kg/m as calculated from the following:   Height as of this encounter: '5\' 4"'  (1.626 m).   Weight as of this encounter: 45.5 kg.  Poor p.o. intake as she was not preparing her meals regularly. -Dietitian consult -TOC consult to see if we can arrange Meals on Wheels  Nicotine dependence.  Counseling was provided. -Continue with nicotine patch  Objective: Vitals:   04/24/21 0341 04/24/21 0740 04/24/21 0828 04/24/21 1423  BP: 119/77  (!) 134/97 137/84  Pulse: 99  (!) 106 (!) 107  Resp: 16  19 (!) 28  Temp: (!) 97.5 F (36.4 C)  98.3 F (36.8 C) 97.7 F (36.5 C)  TempSrc:   Oral Oral  SpO2: 98% 96% 92% 98%  Weight:      Height:  Intake/Output Summary (Last 24 hours) at 04/24/2021 1441 Last data filed at 04/24/2021 1247 Gross per 24 hour  Intake 4469.01 ml  Output 1150 ml  Net 3319.01 ml   Filed Weights   04/22/21 0827  Weight: 45.5 kg     Examination:  General.  Malnourished lady, in no acute distress. Pulmonary.  Lungs clear bilaterally, mildly increased work of breathing. CV.  Regular rate and rhythm, no JVD, rub or murmur. Abdomen.  Soft, nontender, nondistended, BS positive. CNS.  Alert and oriented.  No focal neurologic deficit. Extremities.  No edema, no cyanosis, pulses intact and symmetrical. Psychiatry.  Judgment and insight appears normal.    DVT prophylaxis: Lovenox Code Status: DNR Family Communication: A friend was updated at bedside.   Disposition Plan:  Status is: Inpatient  Remains inpatient appropriate because:Inpatient level of care appropriate due to severity of illness  Dispo: The patient is from: Home              Anticipated d/c is to: Home              Patient currently is not medically stable to d/c.   Difficult to place patient No              Level of care: Med-Surg  All the records are reviewed and case discussed with Care Management/Social Worker. Management plans discussed with the patient, nursing and they are in agreement.  Consultants:  None  Procedures:  Antimicrobials: Zithromax  Data Reviewed: I have personally reviewed following labs and imaging studies  CBC: Recent Labs  Lab 04/22/21 0831 04/23/21 0405  WBC 14.9* 13.2*  NEUTROABS 11.8*  --   HGB 14.6 12.3  HCT 43.8 37.0  MCV 97.1 96.9  PLT 337 099    Basic Metabolic Panel: Recent Labs  Lab 04/22/21 0831 04/23/21 0405  NA 140 139  K 4.1 4.1  CL 103 106  CO2 27 27  GLUCOSE 103* 131*  BUN 11 13  CREATININE 0.80 0.67  CALCIUM 9.2 8.2*  MG 1.8  --     GFR: Estimated Creatinine Clearance: 51.7 mL/min (by C-G formula based on SCr of 0.67 mg/dL). Liver Function Tests: Recent Labs  Lab 04/22/21 0831  AST 21  ALT 18  ALKPHOS 67  BILITOT 1.2  PROT 6.6  ALBUMIN 3.5    No results for input(s): LIPASE, AMYLASE in the last 168 hours. No results for input(s): AMMONIA in the last 168  hours. Coagulation Profile: Recent Labs  Lab 04/22/21 0831  INR 0.9    Cardiac Enzymes: No results for input(s): CKTOTAL, CKMB, CKMBINDEX, TROPONINI in the last 168 hours. BNP (last 3 results) No results for input(s): PROBNP in the last 8760 hours. HbA1C: No results for input(s): HGBA1C in the last 72 hours. CBG: No results for input(s): GLUCAP in the last 168 hours. Lipid Profile: No results for input(s): CHOL, HDL, LDLCALC, TRIG, CHOLHDL, LDLDIRECT in the last 72 hours. Thyroid Function Tests: No results for input(s): TSH, T4TOTAL, FREET4, T3FREE, THYROIDAB in the last 72 hours. Anemia Panel: No results for input(s): VITAMINB12, FOLATE, FERRITIN, TIBC, IRON, RETICCTPCT in the last 72 hours. Sepsis Labs: Recent Labs  Lab 04/22/21 0831 04/22/21 1019  PROCALCITON <0.10  --   LATICACIDVEN  --  1.1     Recent Results (from the past 240 hour(s))  Blood culture (routine single)     Status: None (Preliminary result)   Collection Time: 04/22/21  9:45 AM   Specimen: BLOOD LEFT FOREARM  Result Value Ref Range Status   Specimen Description BLOOD LEFT FOREARM  Final   Special Requests BOTTLES DRAWN AEROBIC AND ANAEROBIC  Final   Culture   Final    NO GROWTH 2 DAYS Performed at [facility], [address] [place], [place] [address]    Report Status PENDING  Incomplete  Resp Panel by RT-PCR (Flu A&B, Covid) Nasopharyngeal Swab     Status: None   Collection Time: 04/22/21  9:45 AM   Specimen: Nasopharyngeal Swab; Nasopharyngeal(NP) swabs in vial transport medium  Result Value Ref Range Status   SARS Coronavirus 2 by RT PCR NEGATIVE NEGATIVE Final    Comment: (NOTE) SARS-CoV-2 target nucleic acids are NOT DETECTED.  The SARS-CoV-2 RNA is generally detectable in upper respiratory specimens during the acute phase of infection. The lowest concentration of SARS-CoV-2 viral copies this assay can detect is 138 copies/mL. A negative result does not preclude  SARS-Cov-2 infection and should not be used as the sole basis for treatment or other patient management decisions. A negative result may occur with  improper specimen collection/handling, submission of specimen other than nasopharyngeal swab, presence of viral mutation(s) within the areas targeted by this assay, and inadequate number of viral copies(<138 copies/mL). A negative result must be combined with clinical observations, patient history, and epidemiological information. The expected result is Negative.  Fact Sheet for Patients:  EntrepreneurPulse.com.au  Fact Sheet for Healthcare Providers:  IncredibleEmployment.be  This test is no t yet approved or cleared by the [place] FDA and  has been authorized for detection and/or diagnosis of SARS-CoV-2 by FDA under an Emergency Use Authorization (EUA). This EUA will remain  in effect (meaning this test can be used) for the duration of the COVID-19 declaration under Section 564(b)(1) of the Act, 21 U.S.C.section 360bbb-3(b)(1), unless the authorization is terminated  or revoked sooner.       Influenza A by PCR NEGATIVE NEGATIVE Final   Influenza B by PCR NEGATIVE NEGATIVE Final    Comment: (NOTE) The Xpert Xpress SARS-CoV-2/FLU/RSV plus assay is intended as an aid in the diagnosis of influenza from Nasopharyngeal swab specimens and should not be used as a sole basis for treatment. Nasal washings and aspirates are unacceptable for Xpert Xpress SARS-CoV-2/FLU/RSV testing.  Fact Sheet for Patients: EntrepreneurPulse.com.au  Fact Sheet for Healthcare Providers: IncredibleEmployment.be  This test is not yet approved or cleared by the [place] FDA and has been authorized for detection and/or diagnosis of SARS-CoV-2 by FDA under an Emergency Use Authorization (EUA). This EUA will remain in effect (meaning this test can be used) for the duration of  the COVID-19 declaration under Section 564(b)(1) of the Act, 21 U.S.C. section 360bbb-3(b)(1), unless the authorization is terminated or revoked.  Performed at [facility], [place], [place] [address]   Respiratory (~20 pathogens) panel by PCR     Status: None   Collection Time: 04/23/21 12:14 PM   Specimen: Nasopharyngeal Swab; Respiratory  Result Value Ref Range Status   Adenovirus NOT DETECTED NOT DETECTED Final   Coronavirus 229E NOT DETECTED NOT DETECTED Final    Comment: (NOTE) The Coronavirus on the Respiratory Panel, DOES NOT test for the novel  Coronavirus (2019 nCoV)    Coronavirus HKU1 NOT DETECTED NOT DETECTED Final   Coronavirus NL63 NOT DETECTED NOT DETECTED Final   Coronavirus OC43 NOT DETECTED NOT DETECTED Final   Metapneumovirus NOT DETECTED NOT DETECTED Final   Rhinovirus / Enterovirus NOT DETECTED NOT DETECTED Final  Influenza A NOT DETECTED NOT DETECTED Final   Influenza B NOT DETECTED NOT DETECTED Final   Parainfluenza Virus 1 NOT DETECTED NOT DETECTED Final   Parainfluenza Virus 2 NOT DETECTED NOT DETECTED Final   Parainfluenza Virus 3 NOT DETECTED NOT DETECTED Final   Parainfluenza Virus 4 NOT DETECTED NOT DETECTED Final   Respiratory Syncytial Virus NOT DETECTED NOT DETECTED Final   Bordetella pertussis NOT DETECTED NOT DETECTED Final   Bordetella Parapertussis NOT DETECTED NOT DETECTED Final   Chlamydophila pneumoniae NOT DETECTED NOT DETECTED Final   Mycoplasma pneumoniae NOT DETECTED NOT DETECTED Final    Comment: Performed at [facility], [place] [address], [place], [place] [address]      Radiology Studies: No results found.  Scheduled Meds:  ALPRAZolam  1 mg Oral QID   vitamin C  250 mg Oral BID   enoxaparin (LOVENOX) injection  40 mg Subcutaneous Q24H   feeding supplement  1 Container Oral TID BM   FLUoxetine  40 mg Oral Daily   fluticasone furoate-vilanterol  1 puff Inhalation Daily   ipratropium-albuterol   3 mL Nebulization Q6H   levothyroxine  88 mcg Oral QAC breakfast   multivitamin with minerals  1 tablet Oral Daily   nicotine  14 mg Transdermal Daily   pantoprazole  40 mg Oral Daily   predniSONE  40 mg Oral Q breakfast   QUEtiapine  400 mg Oral BID   tiZANidine  4 mg Oral TID   Continuous Infusions:  sodium chloride 125 mL/hr at 04/24/21 1410   azithromycin 500 mg (04/24/21 0849)     LOS: 2 days   Time spent: 38 minutes. More than 50% of the time was spent in counseling/coordination of care  Lorella Nimrod, MD [facility]  If 7PM-7AM, please contact night-coverage [email or web address]  04/24/2021, 2:41 PM   This record has been created using Systems analyst. Errors have been sought and corrected,but may not always be located. Such creation errors do not reflect on the standard of care.

## 2021-04-24 NOTE — Progress Notes (Signed)
Mobility Specialist - Progress Note   04/24/21 1700  Mobility  Activity Refused mobility  Mobility performed by Mobility specialist    Pt politely declined mobility, reports feeling at/near baseline.    Doris Jenkins Mobility Specialist 04/24/21, 5:04 PM

## 2021-04-25 NOTE — Progress Notes (Signed)
OT Cancellation Note  Patient Details Name: Doris Jenkins MRN: [id number] DOB: [birth year]   Cancelled Treatment:    Reason Eval/Treat Not Completed: Other (comment) Pt requesting to shower yesterday and OT got approval from MD via secure chat. However, this morning, pt's RR reaching upper 30s just resting in bed. Notified RN and CNA. Will hold off at this time to conserve pt energy. Will f/u at later date/time. Thank you.  Rejeana Brock, MS, OTR/L ascom [phone] 04/25/21, 9:32 AM

## 2021-04-25 NOTE — TOC Initial Note (Signed)
Transition of Care [facility]) - Initial/Assessment Note    Patient Details  Name: Doris Jenkins MRN: [id number] Date of Birth: 1955/11/21  Transition of Care [facility]) CM/SW Contact:    Chapman Fitch, RN Phone Number: 04/25/2021, 3:10 PM  Clinical Narrative:                 Patient admitted from home with sepsis Patient lives at home alone Son lives locally Neighbor Doris assist with providing transportation  Patient has chronic O2, RW and BSC at home  Referral to [facility] with [facility] for rollator  Patient agreeable to home health services.  States she does not have preference agency.  Referral made to [facility] with [facility]    Expected Discharge Plan: Home w Home Health Services Barriers to Discharge: Continued Medical Work up   Patient Goals and CMS Choice        Expected Discharge Plan and Services Expected Discharge Plan: Home w Home Health Services       Living arrangements for the past 2 months: Single Family Home                 DME Arranged: Walker rolling with seat DME Agency: [employer] Date DME Agency Contacted: 04/25/21   Representative spoke with at DME Agency: Pamelia Hoit HH Arranged: PT, RN, OT HH Agency: [facility] Services        Prior Living Arrangements/Services Living arrangements for the past 2 months: Single Family Home Lives with:: Self Patient language and need for interpreter reviewed:: Yes Do you feel safe going back to the place where you live?: Yes      Need for Family Participation in Patient Care: Yes (Comment) Care giver support system in place?: Yes (comment) Current home services: DME Criminal Activity/Legal Involvement Pertinent to Current Situation/Hospitalization: No - Comment as needed  Activities of Daily Living Home Assistive Devices/Equipment: Eyeglasses, Oxygen, Dentures (specify type), Nebulizer ADL Screening (condition at time of admission) Patient's cognitive ability adequate to safely complete daily activities?:  Yes Is the patient deaf or have difficulty hearing?: No Does the patient have difficulty seeing, even when wearing glasses/contacts?: No Does the patient have difficulty concentrating, remembering, or making decisions?: No Patient able to express need for assistance with ADLs?: Yes Does the patient have difficulty dressing or bathing?: No Independently performs ADLs?: Yes (appropriate for developmental age) Does the patient have difficulty walking or climbing stairs?: No Weakness of Legs: Both Weakness of Arms/Hands: Both  Permission Sought/Granted                  Emotional Assessment       Orientation: : Oriented to Self, Oriented to Place, Oriented to  Time, Oriented to Situation   Psych Involvement: No (comment)  Admission diagnosis:  SOB (shortness of breath) [R06.02] COPD exacerbation (HCC) [J44.1] CAP (community acquired pneumonia) [J18.9] Community acquired pneumonia, unspecified laterality [J18.9] Sepsis, due to unspecified organism, unspecified whether acute organ dysfunction present [facility]) [A41.9] Patient Active Problem List   Diagnosis Date Noted   Protein-calorie malnutrition, severe 04/23/2021   CAP (community acquired pneumonia) 04/22/2021   Sepsis (HCC) 04/22/2021   Acute hypoxemic respiratory failure (HCC) 02/14/2021   Acute respiratory failure with hypoxia (HCC) 02/13/2021   COPD exacerbation (HCC) 02/13/2021   Hypokalemia 02/13/2021   Encephalopathy 06/06/2016   Acute delirium 06/06/2016   Bipolar disorder (HCC) 06/06/2016   Neuropathic pain 05/09/2015   Essential hypertension 06/05/2014   Back pain, chronic 07/27/2013   COPD (chronic obstructive pulmonary disease) (  HCC) 07/27/2013   Hypothyroidism 07/27/2013   PCP:  Pcp, No Pharmacy:   [id number] [place], [place] - [address] [place] [address] Phone: [phone] Fax: [phone]     Social Determinants of Health (SDOH) Interventions    Readmission  Risk Interventions No flowsheet data found.

## 2021-04-25 NOTE — Progress Notes (Signed)
PROGRESS NOTE    Doris Jenkins  KZL:[id number] DOB: 1955-10-07 DOA: 04/22/2021 PCP: Pcp, No   Brief Narrative: Taken from H&P. Doris Jenkins is a 65 y.o. female with medical history significant for bipolar disorder, COPD with chronic respiratory failure on 3 L of oxygen, hypertension, low back pain, GERD, lithium induced hypothyroidism, migraine, nicotine dependence who presents to the ER for evaluation of worsening shortness of breath associated with a cough productive of gray phlegm, chills and myalgias. Symptoms started approximately a week ago and progressively worsened.  She was also having pleuritic chest pain.  She was vaccinated against COVID-19.  Recent hospitalization in June. COVID-19 and influenza negative. Chest x-ray with emphysema and bibasilar infiltrates consistent with multifocal pneumonia. Initially started on ceftriaxone and Zithromax for concern of CAP.  Procalcitonin negative, most likely viral.  MRSA PCR and respiratory viral panel negative.  Patient continues to feel very weak and becoming tachypneic at times.  Does not think that she can take care of herself at home.  Subjective: Patient was resting comfortably when I entered in the room.  Easily arousable but started hyperventilating and stating that she is very weak and cannot go home as she lives alone, requesting to stay for another day so she can take care of herself.  Assessment & Plan:   Principal Problem:   Sepsis ([place]) Active Problems:   Bipolar disorder ([place])   Essential hypertension   Hypothyroidism   COPD exacerbation ([place])   CAP (community acquired pneumonia)   Protein-calorie malnutrition, severe  Sepsis secondary to multifocal pneumonia.  Most likely viral as procalcitonin is negative. Initially met sepsis criteria with tachycardia, tachypnea, leukocytosis and multifocal infiltrates on chest x-ray.  Currently saturating well on her baseline oxygen requirement of 2 to 3 L.  Blood cultures negative  so far.  MRSA swab and respiratory viral panel were negative.  Ceftriaxone was discontinued. -Continue with Zithromax -Continue with supportive care  COPD exacerbation.  No wheezing today.  Most likely secondary to viral upper respiratory infection/viral pneumonia.  Saturating well on her baseline oxygen requirement. -Continue with supportive therapy. -Continue with bronchodilators -Continue with steroids -Continue with supplemental oxygen to keep the saturation above 90%.  Weakness/fatigue.  Per patient she was experiencing worsening fatigue and weakness for a while now.  Unable to complete her ADLs.  Dependent on other person to bring some meals otherwise she will not eat much. -PT/OT evaluation-recommending home health services which were ordered. -TOC to look for resources and provide her regarding getting meals. -She will get benefit from home health services  Hypothyroidism. -Continue home Synthroid.  History of bipolar disorder.  No acute concern. -Continue home dose of alprazolam, fluoxetine and Seroquel  Severe protein caloric malnutrition. Estimated body mass index is 17.22 kg/m as calculated from the following:   Height as of this encounter: '5\' 4"'  (1.626 m).   Weight as of this encounter: 45.5 kg.  Poor p.o. intake as she was not preparing her meals regularly. -Dietitian consult -TOC consult to see if we can arrange Meals on Wheels  Nicotine dependence.  Counseling was provided. -Continue with nicotine patch  Objective: Vitals:   04/24/21 2030 04/25/21 0148 04/25/21 0422 04/25/21 0757  BP:   (!) 147/90   Pulse: 99  95   Resp: 20  (!) 24   Temp:   97.9 F (36.6 C)   TempSrc:   Oral   SpO2:  97% 99% 98%  Weight:      Height:  Intake/Output Summary (Last 24 hours) at 04/25/2021 1540 Last data filed at 04/25/2021 1100 Gross per 24 hour  Intake 4496.71 ml  Output 1500 ml  Net 2996.71 ml    Filed Weights   04/22/21 0827  Weight: 45.5 kg     Examination:  General.  Chronically ill-appearing lady, in no acute distress. Pulmonary.  Lungs clear bilaterally, normal respiratory effort. CV.  Regular rate and rhythm, no JVD, rub or murmur. Abdomen.  Soft, nontender, nondistended, BS positive. CNS.  Alert and oriented .  No focal neurologic deficit. Extremities.  No edema, no cyanosis, pulses intact and symmetrical. Psychiatry.  Judgment and insight appears normal.    DVT prophylaxis: Lovenox Code Status: DNR Family Communication: None at bedside Disposition Plan:  Status is: Inpatient  Remains inpatient appropriate because:Inpatient level of care appropriate due to severity of illness  Dispo: The patient is from: Home              Anticipated d/c is to: Home              Patient currently is not medically stable to d/c.   Difficult to place patient No              Level of care: Med-Surg  All the records are reviewed and case discussed with Care Management/Social Worker. Management plans discussed with the patient, nursing and they are in agreement.  Consultants:  None  Procedures:  Antimicrobials: Zithromax  Data Reviewed: I have personally reviewed following labs and imaging studies  CBC: Recent Labs  Lab 04/22/21 0831 04/23/21 0405  WBC 14.9* 13.2*  NEUTROABS 11.8*  --   HGB 14.6 12.3  HCT 43.8 37.0  MCV 97.1 96.9  PLT 337 161    Basic Metabolic Panel: Recent Labs  Lab 04/22/21 0831 04/23/21 0405  NA 140 139  K 4.1 4.1  CL 103 106  CO2 27 27  GLUCOSE 103* 131*  BUN 11 13  CREATININE 0.80 0.67  CALCIUM 9.2 8.2*  MG 1.8  --     GFR: Estimated Creatinine Clearance: 51.7 mL/min (by C-G formula based on SCr of 0.67 mg/dL). Liver Function Tests: Recent Labs  Lab 04/22/21 0831  AST 21  ALT 18  ALKPHOS 67  BILITOT 1.2  PROT 6.6  ALBUMIN 3.5    No results for input(s): LIPASE, AMYLASE in the last 168 hours. No results for input(s): AMMONIA in the last 168 hours. Coagulation  Profile: Recent Labs  Lab 04/22/21 0831  INR 0.9    Cardiac Enzymes: No results for input(s): CKTOTAL, CKMB, CKMBINDEX, TROPONINI in the last 168 hours. BNP (last 3 results) No results for input(s): PROBNP in the last 8760 hours. HbA1C: No results for input(s): HGBA1C in the last 72 hours. CBG: No results for input(s): GLUCAP in the last 168 hours. Lipid Profile: No results for input(s): CHOL, HDL, LDLCALC, TRIG, CHOLHDL, LDLDIRECT in the last 72 hours. Thyroid Function Tests: No results for input(s): TSH, T4TOTAL, FREET4, T3FREE, THYROIDAB in the last 72 hours. Anemia Panel: No results for input(s): VITAMINB12, FOLATE, FERRITIN, TIBC, IRON, RETICCTPCT in the last 72 hours. Sepsis Labs: Recent Labs  Lab 04/22/21 0831 04/22/21 1019  PROCALCITON <0.10  --   LATICACIDVEN  --  1.1     Recent Results (from the past 240 hour(s))  Blood culture (routine single)     Status: None (Preliminary result)   Collection Time: 04/22/21  9:45 AM   Specimen: BLOOD LEFT FOREARM  Result Value Ref  Range Status   Specimen Description BLOOD LEFT FOREARM  Final   Special Requests BOTTLES DRAWN AEROBIC AND ANAEROBIC  Final   Culture   Final    NO GROWTH 3 DAYS Performed at [facility], [address] [place], [place] [address]    Report Status PENDING  Incomplete  Resp Panel by RT-PCR (Flu A&B, Covid) Nasopharyngeal Swab     Status: None   Collection Time: 04/22/21  9:45 AM   Specimen: Nasopharyngeal Swab; Nasopharyngeal(NP) swabs in vial transport medium  Result Value Ref Range Status   SARS Coronavirus 2 by RT PCR NEGATIVE NEGATIVE Final    Comment: (NOTE) SARS-CoV-2 target nucleic acids are NOT DETECTED.  The SARS-CoV-2 RNA is generally detectable in upper respiratory specimens during the acute phase of infection. The lowest concentration of SARS-CoV-2 viral copies this assay can detect is 138 copies/mL. A negative result does not preclude SARS-Cov-2 infection and should  not be used as the sole basis for treatment or other patient management decisions. A negative result may occur with  improper specimen collection/handling, submission of specimen other than nasopharyngeal swab, presence of viral mutation(s) within the areas targeted by this assay, and inadequate number of viral copies(<138 copies/mL). A negative result must be combined with clinical observations, patient history, and epidemiological information. The expected result is Negative.  Fact Sheet for Patients:  EntrepreneurPulse.com.au  Fact Sheet for Healthcare Providers:  IncredibleEmployment.be  This test is no t yet approved or cleared by the [place] FDA and  has been authorized for detection and/or diagnosis of SARS-CoV-2 by FDA under an Emergency Use Authorization (EUA). This EUA will remain  in effect (meaning this test can be used) for the duration of the COVID-19 declaration under Section 564(b)(1) of the Act, 21 U.S.C.section 360bbb-3(b)(1), unless the authorization is terminated  or revoked sooner.       Influenza A by PCR NEGATIVE NEGATIVE Final   Influenza B by PCR NEGATIVE NEGATIVE Final    Comment: (NOTE) The Xpert Xpress SARS-CoV-2/FLU/RSV plus assay is intended as an aid in the diagnosis of influenza from Nasopharyngeal swab specimens and should not be used as a sole basis for treatment. Nasal washings and aspirates are unacceptable for Xpert Xpress SARS-CoV-2/FLU/RSV testing.  Fact Sheet for Patients: EntrepreneurPulse.com.au  Fact Sheet for Healthcare Providers: IncredibleEmployment.be  This test is not yet approved or cleared by the [place] FDA and has been authorized for detection and/or diagnosis of SARS-CoV-2 by FDA under an Emergency Use Authorization (EUA). This EUA will remain in effect (meaning this test can be used) for the duration of the COVID-19 declaration under  Section 564(b)(1) of the Act, 21 U.S.C. section 360bbb-3(b)(1), unless the authorization is terminated or revoked.  Performed at [facility], [place], [place] [address]   Respiratory (~20 pathogens) panel by PCR     Status: None   Collection Time: 04/23/21 12:14 PM   Specimen: Nasopharyngeal Swab; Respiratory  Result Value Ref Range Status   Adenovirus NOT DETECTED NOT DETECTED Final   Coronavirus 229E NOT DETECTED NOT DETECTED Final    Comment: (NOTE) The Coronavirus on the Respiratory Panel, DOES NOT test for the novel  Coronavirus (2019 nCoV)    Coronavirus HKU1 NOT DETECTED NOT DETECTED Final   Coronavirus NL63 NOT DETECTED NOT DETECTED Final   Coronavirus OC43 NOT DETECTED NOT DETECTED Final   Metapneumovirus NOT DETECTED NOT DETECTED Final   Rhinovirus / Enterovirus NOT DETECTED NOT DETECTED Final   Influenza  A NOT DETECTED NOT DETECTED Final   Influenza B NOT DETECTED NOT DETECTED Final   Parainfluenza Virus 1 NOT DETECTED NOT DETECTED Final   Parainfluenza Virus 2 NOT DETECTED NOT DETECTED Final   Parainfluenza Virus 3 NOT DETECTED NOT DETECTED Final   Parainfluenza Virus 4 NOT DETECTED NOT DETECTED Final   Respiratory Syncytial Virus NOT DETECTED NOT DETECTED Final   Bordetella pertussis NOT DETECTED NOT DETECTED Final   Bordetella Parapertussis NOT DETECTED NOT DETECTED Final   Chlamydophila pneumoniae NOT DETECTED NOT DETECTED Final   Mycoplasma pneumoniae NOT DETECTED NOT DETECTED Final    Comment: Performed at [facility], [place] [address] [place], [place] [address]  MRSA Next Gen by PCR, Nasal     Status: None   Collection Time: 04/23/21  2:16 PM   Specimen: Nasal Mucosa; Nasal Swab  Result Value Ref Range Status   MRSA by PCR Next Gen NOT DETECTED NOT DETECTED Final    Comment: (NOTE) The GeneXpert MRSA Assay (FDA approved for NASAL specimens only), is one component of a comprehensive MRSA colonization surveillance program. It  is not intended to diagnose MRSA infection nor to guide or monitor treatment for MRSA infections. Test performance is not FDA approved in patients less than 28 years old. Performed at [facility], [address] [place],  [address]       Radiology Studies: No results found.  Scheduled Meds:  ALPRAZolam  1 mg Oral QID   vitamin C  250 mg Oral BID   enoxaparin (LOVENOX) injection  40 mg Subcutaneous Q24H   feeding supplement  1 Container Oral TID BM   FLUoxetine  40 mg Oral Daily   fluticasone furoate-vilanterol  1 puff Inhalation Daily   ipratropium-albuterol  3 mL Nebulization Q6H   levothyroxine  88 mcg Oral QAC breakfast   multivitamin with minerals  1 tablet Oral Daily   nicotine  14 mg Transdermal Daily   pantoprazole  40 mg Oral Daily   predniSONE  40 mg Oral Q breakfast   QUEtiapine  400 mg Oral BID   tiZANidine  4 mg Oral TID   Continuous Infusions:  sodium chloride 125 mL/hr at 04/25/21 0650   azithromycin 500 mg (04/25/21 0851)     LOS: 3 days   Time spent: 34 minutes. More than 50% of the time was spent in counseling/coordination of care  Lorella Nimrod, MD [facility]  If 7PM-7AM, please contact night-coverage [email or web address]  04/25/2021, 3:40 PM   This record has been created using Systems analyst. Errors have been sought and corrected,but may not always be located. Such creation errors do not reflect on the standard of care.

## 2021-04-25 NOTE — Evaluation (Signed)
Physical Therapy Evaluation Patient Details Name: Doris Jenkins MRN: [id number] DOB: 01-Dec-1955 Today's Date: 04/25/2021   History of Present Illness  Pt is a 65 y/o F with PMH: bipolar, COPD (on 3Lnc at home), HTN and GERD who presented to [facility] d/t wrosening SOB. Pt found to have multifocal PNA.  Clinical Impression  Pt seen for PT evaluation with pt on 3L/min via nasal cannula. Pt reports she needs to get her breathing under control with PT educating her on upright sitting position facilitating increased ease of breathing. Pt is able to complete bed mobility with mod I, STS & Stand pivot to [facility] with CGA without AD (pt with continent void on [facility]), and take 2-3 side steps at EOB without AD & CGA. No recliner in room so positioned bed in chair position & educated pt on need for upright posture to promote better breathing. Lowest SpO2 88%, otherwise >/= 90%, RR 17-35 during session. Anticipate pt can mobilize well once her breathing is not as labored. Will continue to follow pt acutely to progress mobility as able.     Follow Up Recommendations Home health PT;Supervision - Intermittent    Equipment Recommendations   (rollator)    Recommendations for Other Services       Precautions / Restrictions Precautions Precautions: Fall Restrictions Weight Bearing Restrictions: No      Mobility  Bed Mobility Overal bed mobility: Modified Independent             General bed mobility comments: supine<>sit with HOB elevated, bed rails    Transfers Overall transfer level: Needs assistance Equipment used: None Transfers: Sit to/from Stand;Stand Pivot Transfers Sit to Stand: Min guard Stand pivot transfers: Min guard          Ambulation/Gait Ambulation/Gait assistance: Min guard Gait Distance (Feet): 2 Feet Assistive device: None Gait Pattern/deviations: Decreased step length - right;Decreased step length - left;Decreased stride length Gait velocity: decreased   General Gait  Details: 2 side steps to L at [employer]            Wheelchair Mobility    Modified Rankin (Stroke Patients Only)       Balance Overall balance assessment: Needs assistance Sitting-balance support: No upper extremity supported Sitting balance-Leahy Scale: Normal       Standing balance-Leahy Scale: Fair                               Pertinent Vitals/Pain Pain Assessment: No/denies pain    Home Living Family/patient expects to be discharged to:: Private residence Living Arrangements: Alone Available Help at Discharge: Family;Available PRN/intermittently Type of Home: House Home Access: Stairs to enter Entrance Stairs-Rails: Right Entrance Stairs-Number of Steps: 4 Home Layout: One level Home Equipment: Walker - 2 wheels;Shower seat      Prior Function Level of Independence: Independent         Comments: pt endorses 2 recent falls, no AD for fxl mobility at baseline, has 2WW that she does not use. INDEP with ADLs (MOD I with bathing-uses shower seat)     Hand Dominance   Dominant Hand: Right    Extremity/Trunk Assessment   Upper Extremity Assessment Upper Extremity Assessment: Generalized weakness    Lower Extremity Assessment Lower Extremity Assessment: Generalized weakness    Cervical / Trunk Assessment Cervical / Trunk Assessment: Kyphotic  Communication   Communication: No difficulties  Cognition Arousal/Alertness: Awake/alert Behavior During Therapy: WFL for tasks assessed/performed Overall  Cognitive Status: Within Functional Limits for tasks assessed                                        General Comments      Exercises     Assessment/Plan    PT Assessment Patient needs continued PT services  PT Problem List Decreased strength;Decreased mobility;Decreased activity tolerance;Decreased balance;Cardiopulmonary status limiting activity       PT Treatment Interventions DME instruction;Therapeutic  activities;Modalities;Gait training;Therapeutic exercise;Patient/family education;Stair training;Balance training;Functional mobility training;Neuromuscular re-education;Manual techniques    PT Goals (Current goals can be found in the Care Plan section)  Acute Rehab PT Goals Patient Stated Goal: to get my breathing under control PT Goal Formulation: With patient Time For Goal Achievement: 05/09/21 Potential to Achieve Goals: Good    Frequency Min 2X/week   Barriers to discharge Decreased caregiver support;Inaccessible home environment lives alone, 4 STE    Co-evaluation               AM-PAC PT "6 Clicks" Mobility  Outcome Measure Help needed turning from your back to your side while in a flat bed without using bedrails?: None Help needed moving from lying on your back to sitting on the side of a flat bed without using bedrails?: None Help needed moving to and from a bed to a chair (including a wheelchair)?: A Little Help needed standing up from a chair using your arms (e.g., wheelchair or bedside chair)?: A Little Help needed to walk in hospital room?: A Little Help needed climbing 3-5 steps with a railing? : A Lot 6 Click Score: 19    End of Session Equipment Utilized During Treatment: Oxygen Activity Tolerance:  (limited by breathing) Patient left: in bed;with bed alarm set (in upright chair position) Nurse Communication: Mobility status (need for recliner in room, O2/RR during session, pt's request for breathing tx) PT Visit Diagnosis: Muscle weakness (generalized) (M62.81);Difficulty in walking, not elsewhere classified (R26.2)    Time: 0539-7673 PT Time Calculation (min) (ACUTE ONLY): 13 min   Charges:   PT Evaluation $PT Eval Moderate Complexity: 1 Mod          Aleda Grana, PT, DPT 04/25/21, 10:46 AM   Sandi Mariscal 04/25/2021, 10:43 AM

## 2021-04-26 DIAGNOSIS — I10 Essential (primary) hypertension: Secondary | ICD-10-CM

## 2021-04-26 DIAGNOSIS — R0602 Shortness of breath: Secondary | ICD-10-CM

## 2021-04-26 MED ORDER — ASCORBIC ACID 250 MG PO TABS: 250.0000 mg | ORAL_TABLET | Freq: Two times a day (BID) | ORAL | 0 refills | Status: DC

## 2021-04-26 MED ORDER — AZITHROMYCIN 250 MG PO TABS: 500.0000 mg | ORAL_TABLET | Freq: Every day | ORAL | Status: DC

## 2021-04-26 MED ORDER — AZITHROMYCIN 500 MG PO TABS: 500.0000 mg | ORAL_TABLET | Freq: Every day | ORAL | 0 refills | Status: AC

## 2021-04-26 MED ORDER — ADULT MULTIVITAMIN W/MINERALS CH: 1.0000 | ORAL_TABLET | Freq: Every day | ORAL | 1 refills | Status: DC

## 2021-04-26 NOTE — TOC Transition Note (Addendum)
Transition of Care [facility]) - CM/SW Discharge Note   Patient Details  Name: Doris Jenkins MRN: [id number] Date of Birth: Apr 12, 1956  [facility]) CM/SW Contact:  Liliana Cline, LCSW Phone Number: 04/26/2021, 12:17 PM   Clinical Narrative:   Patient to discharge home today. CSW notified Elnita Maxwell with [facility]. Asked RN to write Cheryl's number on DC packet for patient to have if needed per [facility] request.     Final next level of care: Home w Home Health Services Barriers to Discharge: Barriers Resolved   Patient Goals and CMS Choice        Discharge Placement                       Discharge Plan and Services                DME Arranged: Walker rolling with seat DME Agency: [employer] Date DME Agency Contacted: 04/25/21   Representative spoke with at DME Agency: Pamelia Hoit HH Arranged: PT, OT, RN [facility] Agency: [employer] [facility] Date [facility] Agency Contacted: 04/26/21   Representative spoke with at [facility] Agency: Becky Sax  Social Determinants of Health (SDOH) Interventions     Readmission Risk Interventions No flowsheet data found.

## 2021-04-26 NOTE — Discharge Summary (Signed)
And 1Physician Discharge Summary  Doris Jenkins PJS:[id number] DOB: [birth year] DOA: 04/22/2021  PCP: Pcp, No  Admit date: 04/22/2021 Discharge date: 04/26/2021  Admitted From: Home Disposition: Home  Recommendations for Outpatient Follow-up:  Follow up with PCP in 1-2 weeks Follow-up with pulmonology in 1 week Please obtain BMP/CBC in one week Please follow up on the following pending results: None  Home Health: Yes Equipment/Devices: Home oxygen, rolling walker Discharge Condition: Stable CODE STATUS: DNR Diet recommendation: Heart Healthy / Carb Modified   Brief/Interim Summary: Doris Jenkins is a 65 y.o. female with medical history significant for bipolar disorder, COPD with chronic respiratory failure on 3 L of oxygen, hypertension, low back pain, GERD, lithium induced hypothyroidism, migraine, nicotine dependence who presents to the ER for evaluation of worsening shortness of breath associated with a cough productive of gray phlegm, chills and myalgias. Symptoms started approximately a week ago and progressively worsened.  She was also having pleuritic chest pain.  She was vaccinated against COVID-19.  Recent hospitalization in June. COVID-19 and influenza negative. Chest x-ray with emphysema and bibasilar infiltrates consistent with multifocal pneumonia/or atelectasis.  She was initially started on ceftriaxone and Zithromax for concern of CAP.  Procalcitonin remain negative.  Viral etiology cannot be ruled out although respiratory viral panel was negative.  MRSA PCR was negative. She received treatment for COPD exacerbation with steroid and Zithromax.  Responded well. Remained lethargic for few days which delayed her initial discharge as she lives alone and couple of neighbors keep an eye on her.  Per patient she was feeling weak enough to prepare her meals most of the days and rely on neighbors to give her something to eat.  She was given resources for Meals on Wheels information. Our  PT evaluated her and they were recommending home health services which were ordered.  She met sepsis criteria with tachycardia, tachypnea, leukocytosis and multifocal infiltrates most likely secondary to viral pneumonia.  Blood cultures remain negative.  Patient was also found to have severe protein caloric malnutrition with BMI of 17.22.  Due to poor p.o. intake and inability to cook and take care of herself.  We suggested to have boost or Ensure on board and work on [employer] on Wheels.  Patient continues to smoke despite having severe COPD.  Oxygen requirement stable at her baseline of 3 L of oxygen.  She was counseled extensively against tobacco abuse.  She will need frequent counseling to help her quit.  She will continue with rest of her home medications and follow-up with her providers.  Discharge Diagnoses:  Principal Problem:   Sepsis ([place]) Active Problems:   Bipolar disorder ([place])   Essential hypertension   Hypothyroidism   COPD exacerbation ([place])   CAP (community acquired pneumonia)   Protein-calorie malnutrition, severe   SOB (shortness of breath)   Discharge Instructions  Discharge Instructions     Diet - low sodium heart healthy   Complete by: As directed    Discharge instructions   Complete by: As directed    It was pleasure taking care of you. Most likely you have some viral infection which exacerbate your underlying lung condition, no sign of bacterial infection.  You are being given Zithromax for 1 more day.. You are also being given steroid for 3 more days. Continue with rest of your medications and keep yourself well-hydrated. You can supplement your diet with Ensure or boost. Try eating regular meals. Follow-up with your primary care doctor and your pulmonologist for  further recommendations.   Increase activity slowly   Complete by: As directed       Allergies as of 04/26/2021       Reactions   Bee Venom Anaphylaxis   Sulfa Antibiotics Itching,  Rash, Shortness Of Breath   Redness   Poison Oak Extract    Other reaction(s): Unknown        Medication List     STOP taking these medications    gabapentin 300 MG capsule Commonly known as: NEURONTIN   gabapentin 400 MG capsule Commonly known as: NEURONTIN   ibuprofen 200 MG tablet Commonly known as: ADVIL   Klor-Con M20 20 MEQ tablet Generic drug: potassium chloride SA   predniSONE 10 MG tablet Commonly known as: DELTASONE   pregabalin 50 MG capsule Commonly known as: LYRICA       TAKE these medications    albuterol 1.25 MG/3ML nebulizer solution Commonly known as: ACCUNEB Take 1.25 mg by nebulization 3 (three) times daily as needed for wheezing or shortness of breath.   albuterol 108 (90 Base) MCG/ACT inhaler Commonly known as: VENTOLIN HFA Inhale 2 puffs into the lungs every 4 (four) hours as needed.   ALPRAZolam 1 MG tablet Commonly known as: XANAX Take 1 mg by mouth 4 (four) times daily.   ascorbic acid 250 MG tablet Commonly known as: VITAMIN C Take 1 tablet (250 mg total) by mouth 2 (two) times daily.   azithromycin 500 MG tablet Commonly known as: ZITHROMAX Take 1 tablet (500 mg total) by mouth daily for 1 dose. Tomorrow.   Breo Ellipta 100-25 MCG/INH Aepb Generic drug: fluticasone furoate-vilanterol Inhale 1 puff into the lungs daily.   EPINEPHrine 0.3 mg/0.3 mL Soaj injection Commonly known as: EPI-PEN Inject 0.3 mg into the muscle as needed.   FLUoxetine 40 MG capsule Commonly known as: PROZAC Take 40 mg by mouth daily.   levothyroxine 88 MCG tablet Commonly known as: SYNTHROID Take 88 mcg by mouth daily before breakfast.   multivitamin with minerals Tabs tablet Take 1 tablet by mouth daily.   naproxen 500 MG tablet Commonly known as: NAPROSYN Take 500 mg by mouth 2 (two) times daily as needed.   nicotine 14 mg/24hr patch Commonly known as: NICODERM CQ - dosed in mg/24 hours Place 14 mg onto the skin daily.   omeprazole  40 MG capsule Commonly known as: PRILOSEC Take 40 mg by mouth daily.   QUEtiapine 400 MG tablet Commonly known as: SEROQUEL Take 400 mg by mouth 2 (two) times daily. What changed: Another medication with the same name was removed. Continue taking this medication, and follow the directions you see here.   Spiriva HandiHaler 18 MCG inhalation capsule Generic drug: tiotropium Place 1 capsule into inhaler and inhale daily.   tiZANidine 4 MG tablet Commonly known as: ZANAFLEX Take 4 mg by mouth 3 (three) times daily.               Durable Medical Equipment  (From admission, onward)           Start     Ordered   04/25/21 1508  For home use only DME 4 wheeled rolling walker with seat  Once       Question:  Patient needs a walker to treat with the following condition  Answer:  Weakness   04/25/21 1507            Follow-up Information     Erby Pian, MD. Schedule an appointment as soon as  possible for a visit in 1 week(s).   Specialty: Specialist Contact information: [place] [address] [phone]                Allergies  Allergen Reactions   Bee Venom Anaphylaxis   Sulfa Antibiotics Itching, Rash and Shortness Of Breath    Redness    Poison Oak Extract     Other reaction(s): Unknown    Consultations: None  Procedures/Studies: DG Chest Port 1 View  Result Date: 04/22/2021 CLINICAL DATA:  Worsening shortness of breath for 1 week, questionable sepsis, history COPD, hypertension, smoker EXAM: PORTABLE CHEST 1 VIEW COMPARISON:  Portable exam 0843 hours compared to 02/13/2021 FINDINGS: Normal heart size, mediastinal contours, and pulmonary vascularity. Emphysematous and bronchitic changes consistent with COPD. Scattered mild chronic interstitial prominence with new patchy bibasilar infiltrates slightly greater on RIGHT consistent with multifocal pneumonia. No pleural effusion or pneumothorax. Bones demineralized. Surgical  clips LEFT breast and LEFT axilla. IMPRESSION: COPD changes with bibasilar infiltrates consistent with multifocal pneumonia. Emphysema (ICD10-J43.9). Electronically Signed   By: Lavonia Dana M.D.   On: 04/22/2021 09:12    Subjective: Patient was seen and examined today.  She was feeling little improved to go home now.  Continue to have some shortness of breath, appears mildly tachypneic.  We discussed about having a close follow-up with her pulmonologist, per patient she will call Dr. Raul Del and try to see him in early next week.  Discharge Exam: Vitals:   04/26/21 0824 04/26/21 1000  BP:  (!) 141/93  Pulse:  (!) 103  Resp:  20  Temp:  98.7 F (37.1 C)  SpO2: 95% 98%   Vitals:   04/25/21 2056 04/26/21 0452 04/26/21 0824 04/26/21 1000  BP:  (!) 135/97  (!) 141/93  Pulse:  (!) 107  (!) 103  Resp:  20  20  Temp:  98.1 F (36.7 C)  98.7 F (37.1 C)  TempSrc:  Oral    SpO2: 96% 93% 95% 98%  Weight:      Height:        General: Pt is alert, awake, not in acute distress Cardiovascular: RRR, S1/S2 +, no rubs, no gallops Respiratory: CTA bilaterally, no wheezing, no rhonchi Abdominal: Soft, NT, ND, bowel sounds + Extremities: no edema, no cyanosis   The results of significant diagnostics from this hospitalization (including imaging, microbiology, ancillary and laboratory) are listed below for reference.    Microbiology: Recent Results (from the past 240 hour(s))  Blood culture (routine single)     Status: None (Preliminary result)   Collection Time: 04/22/21  9:45 AM   Specimen: BLOOD LEFT FOREARM  Result Value Ref Range Status   Specimen Description BLOOD LEFT FOREARM  Final   Special Requests BOTTLES DRAWN AEROBIC AND ANAEROBIC  Final   Culture   Final    NO GROWTH 4 DAYS Performed at [facility], [address] [place], [place] [address]    Report Status PENDING  Incomplete  Resp Panel by RT-PCR (Flu A&B, Covid) Nasopharyngeal Swab     Status: None    Collection Time: 04/22/21  9:45 AM   Specimen: Nasopharyngeal Swab; Nasopharyngeal(NP) swabs in vial transport medium  Result Value Ref Range Status   SARS Coronavirus 2 by RT PCR NEGATIVE NEGATIVE Final    Comment: (NOTE) SARS-CoV-2 target nucleic acids are NOT DETECTED.  The SARS-CoV-2 RNA is generally detectable in upper respiratory specimens during the acute phase of infection. The lowest concentration of  SARS-CoV-2 viral copies this assay can detect is 138 copies/mL. A negative result does not preclude SARS-Cov-2 infection and should not be used as the sole basis for treatment or other patient management decisions. A negative result may occur with  improper specimen collection/handling, submission of specimen other than nasopharyngeal swab, presence of viral mutation(s) within the areas targeted by this assay, and inadequate number of viral copies(<138 copies/mL). A negative result must be combined with clinical observations, patient history, and epidemiological information. The expected result is Negative.  Fact Sheet for Patients:  EntrepreneurPulse.com.au  Fact Sheet for Healthcare Providers:  IncredibleEmployment.be  This test is no t yet approved or cleared by the [place] FDA and  has been authorized for detection and/or diagnosis of SARS-CoV-2 by FDA under an Emergency Use Authorization (EUA). This EUA will remain  in effect (meaning this test can be used) for the duration of the COVID-19 declaration under Section 564(b)(1) of the Act, 21 U.S.C.section 360bbb-3(b)(1), unless the authorization is terminated  or revoked sooner.       Influenza A by PCR NEGATIVE NEGATIVE Final   Influenza B by PCR NEGATIVE NEGATIVE Final    Comment: (NOTE) The Xpert Xpress SARS-CoV-2/FLU/RSV plus assay is intended as an aid in the diagnosis of influenza from Nasopharyngeal swab specimens and should not be used as a sole basis for treatment.  Nasal washings and aspirates are unacceptable for Xpert Xpress SARS-CoV-2/FLU/RSV testing.  Fact Sheet for Patients: EntrepreneurPulse.com.au  Fact Sheet for Healthcare Providers: IncredibleEmployment.be  This test is not yet approved or cleared by the [place] FDA and has been authorized for detection and/or diagnosis of SARS-CoV-2 by FDA under an Emergency Use Authorization (EUA). This EUA will remain in effect (meaning this test can be used) for the duration of the COVID-19 declaration under Section 564(b)(1) of the Act, 21 U.S.C. section 360bbb-3(b)(1), unless the authorization is terminated or revoked.  Performed at [facility], [place], [place] [address]   Respiratory (~20 pathogens) panel by PCR     Status: None   Collection Time: 04/23/21 12:14 PM   Specimen: Nasopharyngeal Swab; Respiratory  Result Value Ref Range Status   Adenovirus NOT DETECTED NOT DETECTED Final   Coronavirus 229E NOT DETECTED NOT DETECTED Final    Comment: (NOTE) The Coronavirus on the Respiratory Panel, DOES NOT test for the novel  Coronavirus (2019 nCoV)    Coronavirus HKU1 NOT DETECTED NOT DETECTED Final   Coronavirus NL63 NOT DETECTED NOT DETECTED Final   Coronavirus OC43 NOT DETECTED NOT DETECTED Final   Metapneumovirus NOT DETECTED NOT DETECTED Final   Rhinovirus / Enterovirus NOT DETECTED NOT DETECTED Final   Influenza A NOT DETECTED NOT DETECTED Final   Influenza B NOT DETECTED NOT DETECTED Final   Parainfluenza Virus 1 NOT DETECTED NOT DETECTED Final   Parainfluenza Virus 2 NOT DETECTED NOT DETECTED Final   Parainfluenza Virus 3 NOT DETECTED NOT DETECTED Final   Parainfluenza Virus 4 NOT DETECTED NOT DETECTED Final   Respiratory Syncytial Virus NOT DETECTED NOT DETECTED Final   Bordetella pertussis NOT DETECTED NOT DETECTED Final   Bordetella Parapertussis NOT DETECTED NOT DETECTED Final   Chlamydophila pneumoniae NOT  DETECTED NOT DETECTED Final   Mycoplasma pneumoniae NOT DETECTED NOT DETECTED Final    Comment: Performed at [facility], [place]. [address], [place], [place] [address]  MRSA Next Gen by PCR, Nasal     Status: None   Collection Time: 04/23/21  2:16 PM  Specimen: Nasal Mucosa; Nasal Swab  Result Value Ref Range Status   MRSA by PCR Next Gen NOT DETECTED NOT DETECTED Final    Comment: (NOTE) The GeneXpert MRSA Assay (FDA approved for NASAL specimens only), is one component of a comprehensive MRSA colonization surveillance program. It is not intended to diagnose MRSA infection nor to guide or monitor treatment for MRSA infections. Test performance is not FDA approved in patients less than 24 years old. Performed at [facility], [address] [place], [place] [address]      Labs: BNP (last 3 results) No results for input(s): BNP in the last 8760 hours. Basic Metabolic Panel: Recent Labs  Lab 04/22/21 0831 04/23/21 0405  NA 140 139  K 4.1 4.1  CL 103 106  CO2 27 27  GLUCOSE 103* 131*  BUN 11 13  CREATININE 0.80 0.67  CALCIUM 9.2 8.2*  MG 1.8  --    Liver Function Tests: Recent Labs  Lab 04/22/21 0831  AST 21  ALT 18  ALKPHOS 67  BILITOT 1.2  PROT 6.6  ALBUMIN 3.5   No results for input(s): LIPASE, AMYLASE in the last 168 hours. No results for input(s): AMMONIA in the last 168 hours. CBC: Recent Labs  Lab 04/22/21 0831 04/23/21 0405  WBC 14.9* 13.2*  NEUTROABS 11.8*  --   HGB 14.6 12.3  HCT 43.8 37.0  MCV 97.1 96.9  PLT 337 321   Cardiac Enzymes: No results for input(s): CKTOTAL, CKMB, CKMBINDEX, TROPONINI in the last 168 hours. BNP: Invalid input(s): POCBNP CBG: No results for input(s): GLUCAP in the last 168 hours. D-Dimer No results for input(s): DDIMER in the last 72 hours. Hgb A1c No results for input(s): HGBA1C in the last 72 hours. Lipid Profile No results for input(s): CHOL, HDL, LDLCALC, TRIG, CHOLHDL, LDLDIRECT in the last  72 hours. Thyroid function studies No results for input(s): TSH, T4TOTAL, T3FREE, THYROIDAB in the last 72 hours.  Invalid input(s): FREET3 Anemia work up No results for input(s): VITAMINB12, FOLATE, FERRITIN, TIBC, IRON, RETICCTPCT in the last 72 hours. Urinalysis    Component Value Date/Time   COLORURINE YELLOW (A) 04/22/2021 0945   APPEARANCEUR CLEAR (A) 04/22/2021 0945   LABSPEC 1.010 04/22/2021 0945   PHURINE 6.0 04/22/2021 0945   GLUCOSEU NEGATIVE 04/22/2021 0945   HGBUR NEGATIVE 04/22/2021 0945   BILIRUBINUR NEGATIVE 04/22/2021 0945   KETONESUR NEGATIVE 04/22/2021 0945   PROTEINUR NEGATIVE 04/22/2021 0945   NITRITE NEGATIVE 04/22/2021 0945   LEUKOCYTESUR NEGATIVE 04/22/2021 0945   Sepsis Labs Invalid input(s): PROCALCITONIN,  WBC,  LACTICIDVEN Microbiology Recent Results (from the past 240 hour(s))  Blood culture (routine single)     Status: None (Preliminary result)   Collection Time: 04/22/21  9:45 AM   Specimen: BLOOD LEFT FOREARM  Result Value Ref Range Status   Specimen Description BLOOD LEFT FOREARM  Final   Special Requests BOTTLES DRAWN AEROBIC AND ANAEROBIC  Final   Culture   Final    NO GROWTH 4 DAYS Performed at [facility], [address], [place], [place] [address]    Report Status PENDING  Incomplete  Resp Panel by RT-PCR (Flu A&B, Covid) Nasopharyngeal Swab     Status: None   Collection Time: 04/22/21  9:45 AM   Specimen: Nasopharyngeal Swab; Nasopharyngeal(NP) swabs in vial transport medium  Result Value Ref Range Status   SARS Coronavirus 2 by RT PCR NEGATIVE NEGATIVE Final    Comment: (NOTE) SARS-CoV-2 target nucleic acids are NOT  DETECTED.  The SARS-CoV-2 RNA is generally detectable in upper respiratory specimens during the acute phase of infection. The lowest concentration of SARS-CoV-2 viral copies this assay can detect is 138 copies/mL. A negative result does not preclude SARS-Cov-2 infection and should not be used as the sole  basis for treatment or other patient management decisions. A negative result may occur with  improper specimen collection/handling, submission of specimen other than nasopharyngeal swab, presence of viral mutation(s) within the areas targeted by this assay, and inadequate number of viral copies(<138 copies/mL). A negative result must be combined with clinical observations, patient history, and epidemiological information. The expected result is Negative.  Fact Sheet for Patients:  EntrepreneurPulse.com.au  Fact Sheet for Healthcare Providers:  IncredibleEmployment.be  This test is no t yet approved or cleared by the [place] FDA and  has been authorized for detection and/or diagnosis of SARS-CoV-2 by FDA under an Emergency Use Authorization (EUA). This EUA will remain  in effect (meaning this test can be used) for the duration of the COVID-19 declaration under Section 564(b)(1) of the Act, 21 U.S.C.section 360bbb-3(b)(1), unless the authorization is terminated  or revoked sooner.       Influenza A by PCR NEGATIVE NEGATIVE Final   Influenza B by PCR NEGATIVE NEGATIVE Final    Comment: (NOTE) The Xpert Xpress SARS-CoV-2/FLU/RSV plus assay is intended as an aid in the diagnosis of influenza from Nasopharyngeal swab specimens and should not be used as a sole basis for treatment. Nasal washings and aspirates are unacceptable for Xpert Xpress SARS-CoV-2/FLU/RSV testing.  Fact Sheet for Patients: EntrepreneurPulse.com.au  Fact Sheet for Healthcare Providers: IncredibleEmployment.be  This test is not yet approved or cleared by the [place] FDA and has been authorized for detection and/or diagnosis of SARS-CoV-2 by FDA under an Emergency Use Authorization (EUA). This EUA will remain in effect (meaning this test can be used) for the duration of the COVID-19 declaration under Section 564(b)(1) of the Act,  21 U.S.C. section 360bbb-3(b)(1), unless the authorization is terminated or revoked.  Performed at [facility], [place], [place] [address]   Respiratory (~20 pathogens) panel by PCR     Status: None   Collection Time: 04/23/21 12:14 PM   Specimen: Nasopharyngeal Swab; Respiratory  Result Value Ref Range Status   Adenovirus NOT DETECTED NOT DETECTED Final   Coronavirus 229E NOT DETECTED NOT DETECTED Final    Comment: (NOTE) The Coronavirus on the Respiratory Panel, DOES NOT test for the novel  Coronavirus (2019 nCoV)    Coronavirus HKU1 NOT DETECTED NOT DETECTED Final   Coronavirus NL63 NOT DETECTED NOT DETECTED Final   Coronavirus OC43 NOT DETECTED NOT DETECTED Final   Metapneumovirus NOT DETECTED NOT DETECTED Final   Rhinovirus / Enterovirus NOT DETECTED NOT DETECTED Final   Influenza A NOT DETECTED NOT DETECTED Final   Influenza B NOT DETECTED NOT DETECTED Final   Parainfluenza Virus 1 NOT DETECTED NOT DETECTED Final   Parainfluenza Virus 2 NOT DETECTED NOT DETECTED Final   Parainfluenza Virus 3 NOT DETECTED NOT DETECTED Final   Parainfluenza Virus 4 NOT DETECTED NOT DETECTED Final   Respiratory Syncytial Virus NOT DETECTED NOT DETECTED Final   Bordetella pertussis NOT DETECTED NOT DETECTED Final   Bordetella Parapertussis NOT DETECTED NOT DETECTED Final   Chlamydophila pneumoniae NOT DETECTED NOT DETECTED Final   Mycoplasma pneumoniae NOT DETECTED NOT DETECTED Final    Comment: Performed at [facility], [place]. [address] [place], [place] [address]  MRSA Next Gen by PCR, Nasal     Status: None   Collection Time: 04/23/21  2:16 PM   Specimen: Nasal Mucosa; Nasal Swab  Result Value Ref Range Status   MRSA by PCR Next Gen NOT DETECTED NOT DETECTED Final    Comment: (NOTE) The GeneXpert MRSA Assay (FDA approved for NASAL specimens only), is one component of a comprehensive MRSA colonization surveillance program. It is not intended to diagnose  MRSA infection nor to guide or monitor treatment for MRSA infections. Test performance is not FDA approved in patients less than 84 years old. Performed at [facility], [address] [place], [place] [address]     Time coordinating discharge: Over 30 minutes  SIGNED:  Lorella Nimrod, MD  [facility] 04/26/2021, 2:07 PM  If 7PM-7AM, please contact night-coverage [email or web address]  This record has been created using Systems analyst. Errors have been sought and corrected,but may not always be located. Such creation errors do not reflect on the standard of care.

## 2021-04-26 NOTE — Progress Notes (Signed)
PHARMACIST - PHYSICIAN COMMUNICATION  CONCERNING: Antibiotic IV to Oral Route Change Policy  RECOMMENDATION: This patient is receiving azithromycin by the intravenous route.  Based on criteria approved by the Pharmacy and Therapeutics Committee, the antibiotic(s) is/are being converted to the equivalent oral dose form(s).   DESCRIPTION: These criteria include: Patient being treated for a respiratory tract infection, urinary tract infection, cellulitis or clostridium difficile associated diarrhea if on metronidazole The patient is not neutropenic and does not exhibit a GI malabsorption state The patient is eating (either orally or via tube) and/or has been taking other orally administered medications for a least 24 hours The patient is improving clinically and has a Tmax < 100.5  If you have questions about this conversion, please contact the Pharmacy Department   Tameyah Koch B Akeira Lahm  04/26/21    

## 2021-04-27 LAB — CULTURE, BLOOD (SINGLE): Culture: NO GROWTH

## 2021-05-02 ENCOUNTER — Inpatient Hospital Stay
Admission: EM | Admit: 2021-05-02 | Discharge: 2021-05-06 | DRG: 189 | Disposition: A | Discharge status: Home Health | Payer: Medicare Other | Attending: Hospitalist | Admitting: Hospitalist

## 2021-05-02 ENCOUNTER — Emergency Department: Payer: Medicare Other

## 2021-05-02 DIAGNOSIS — Z91030 Bee allergy status: Secondary | ICD-10-CM | POA: Diagnosis not present

## 2021-05-02 DIAGNOSIS — F05 Delirium due to known physiological condition: Secondary | ICD-10-CM | POA: Diagnosis present

## 2021-05-02 DIAGNOSIS — K219 Gastro-esophageal reflux disease without esophagitis: Secondary | ICD-10-CM | POA: Diagnosis present

## 2021-05-02 DIAGNOSIS — E890 Postprocedural hypothyroidism: Secondary | ICD-10-CM | POA: Diagnosis present

## 2021-05-02 DIAGNOSIS — Z515 Encounter for palliative care: Secondary | ICD-10-CM | POA: Diagnosis not present

## 2021-05-02 DIAGNOSIS — J9621 Acute and chronic respiratory failure with hypoxia: Principal | ICD-10-CM

## 2021-05-02 DIAGNOSIS — F17210 Nicotine dependence, cigarettes, uncomplicated: Secondary | ICD-10-CM | POA: Diagnosis present

## 2021-05-02 DIAGNOSIS — I248 Other forms of acute ischemic heart disease: Secondary | ICD-10-CM | POA: Diagnosis present

## 2021-05-02 DIAGNOSIS — J189 Pneumonia, unspecified organism: Secondary | ICD-10-CM | POA: Diagnosis present

## 2021-05-02 DIAGNOSIS — I10 Essential (primary) hypertension: Secondary | ICD-10-CM | POA: Diagnosis present

## 2021-05-02 DIAGNOSIS — E43 Unspecified severe protein-calorie malnutrition: Secondary | ICD-10-CM | POA: Diagnosis present

## 2021-05-02 DIAGNOSIS — F419 Anxiety disorder, unspecified: Secondary | ICD-10-CM | POA: Diagnosis present

## 2021-05-02 DIAGNOSIS — J439 Emphysema, unspecified: Secondary | ICD-10-CM | POA: Diagnosis present

## 2021-05-02 DIAGNOSIS — Z8249 Family history of ischemic heart disease and other diseases of the circulatory system: Secondary | ICD-10-CM

## 2021-05-02 DIAGNOSIS — Z9981 Dependence on supplemental oxygen: Secondary | ICD-10-CM

## 2021-05-02 DIAGNOSIS — F319 Bipolar disorder, unspecified: Secondary | ICD-10-CM | POA: Diagnosis present

## 2021-05-02 DIAGNOSIS — Z882 Allergy status to sulfonamides status: Secondary | ICD-10-CM | POA: Diagnosis not present

## 2021-05-02 DIAGNOSIS — Z79899 Other long term (current) drug therapy: Secondary | ICD-10-CM

## 2021-05-02 DIAGNOSIS — J441 Chronic obstructive pulmonary disease with (acute) exacerbation: Principal | ICD-10-CM

## 2021-05-02 DIAGNOSIS — Z79890 Hormone replacement therapy (postmenopausal): Secondary | ICD-10-CM

## 2021-05-02 DIAGNOSIS — Z7951 Long term (current) use of inhaled steroids: Secondary | ICD-10-CM

## 2021-05-02 DIAGNOSIS — R440 Auditory hallucinations: Secondary | ICD-10-CM | POA: Diagnosis present

## 2021-05-02 DIAGNOSIS — Z20822 Contact with and (suspected) exposure to covid-19: Secondary | ICD-10-CM | POA: Diagnosis present

## 2021-05-02 DIAGNOSIS — Z7189 Other specified counseling: Secondary | ICD-10-CM | POA: Diagnosis not present

## 2021-05-02 DIAGNOSIS — J9622 Acute and chronic respiratory failure with hypercapnia: Secondary | ICD-10-CM | POA: Diagnosis present

## 2021-05-02 DIAGNOSIS — Z681 Body mass index (BMI) 19 or less, adult: Secondary | ICD-10-CM

## 2021-05-02 DIAGNOSIS — Z91048 Other nonmedicinal substance allergy status: Secondary | ICD-10-CM

## 2021-05-02 LAB — COMPREHENSIVE METABOLIC PANEL
ALT: 25 U/L (ref 0–44)
AST: 25 U/L (ref 15–41)
Albumin: 4.2 g/dL (ref 3.5–5.0)
Alkaline Phosphatase: 79 U/L (ref 38–126)
Anion gap: 10 (ref 5–15)
BUN: 18 mg/dL (ref 8–23)
CO2: 28 mmol/L (ref 22–32)
Calcium: 9.8 mg/dL (ref 8.9–10.3)
Chloride: 102 mmol/L (ref 98–111)
Creatinine, Ser: 0.94 mg/dL (ref 0.44–1.00)
GFR, Estimated: >60 mL/min (ref >60)
Glucose, Bld: 107 mg/dL — ABNORMAL HIGH (ref 70–99)
Potassium: 4 mmol/L (ref 3.5–5.1)
Sodium: 140 mmol/L (ref 135–145)
Total Bilirubin: 1.1 mg/dL (ref 0.3–1.2)
Total Protein: 7.3 g/dL (ref 6.5–8.1)

## 2021-05-02 LAB — BLOOD GAS, VENOUS
Acid-Base Excess: 3.4 mmol/L — ABNORMAL HIGH (ref 0.0–2.0)
Acid-base deficit: 0.2 mmol/L (ref 0.0–2.0)
Bicarbonate: 29.6 mmol/L — ABNORMAL HIGH (ref 20.0–28.0)
Bicarbonate: 22.6 mmol/L (ref 20.0–28.0)
Delivery systems: POSITIVE
Delivery systems: POSITIVE
FIO2: 0.35
FIO2: 0.35
O2 Saturation: 43.7 %
O2 Saturation: 97.5 %
Patient temperature: 37
Patient temperature: 37
pCO2, Ven: 50 mmHg (ref 44.0–60.0)
pCO2, Ven: 31 mmHg — ABNORMAL LOW (ref 44.0–60.0)
pH, Ven: 7.38 (ref 7.250–7.430)
pH, Ven: 7.47 — ABNORMAL HIGH (ref 7.250–7.430)
pO2, Ven: 90 mmHg — ABNORMAL HIGH (ref 32.0–45.0)

## 2021-05-02 LAB — CBC WITH DIFFERENTIAL/PLATELET
Abs Immature Granulocytes: 0.17 10*3/uL — ABNORMAL HIGH (ref 0.00–0.07)
Basophils Absolute: 0 10*3/uL (ref 0.0–0.1)
Basophils Relative: 0 %
Eosinophils Absolute: 0 10*3/uL (ref 0.0–0.5)
Eosinophils Relative: 0 %
HCT: 42.2 % (ref 36.0–46.0)
Hemoglobin: 14.6 g/dL (ref 12.0–15.0)
Immature Granulocytes: 1 %
Lymphocytes Relative: 5 %
Lymphs Abs: 1 10*3/uL (ref 0.7–4.0)
MCH: 32.9 pg (ref 26.0–34.0)
MCHC: 34.6 g/dL (ref 30.0–36.0)
MCV: 95 fL (ref 80.0–100.0)
Monocytes Absolute: 1.1 10*3/uL — ABNORMAL HIGH (ref 0.1–1.0)
Monocytes Relative: 5 %
Neutro Abs: 18.6 10*3/uL — ABNORMAL HIGH (ref 1.7–7.7)
Neutrophils Relative %: 89 %
Platelets: 349 10*3/uL (ref 150–400)
RBC: 4.44 MIL/uL (ref 3.87–5.11)
RDW: 14.5 % (ref 11.5–15.5)
WBC: 20.9 10*3/uL — ABNORMAL HIGH (ref 4.0–10.5)
nRBC: 0 % (ref 0.0–0.2)

## 2021-05-02 LAB — RESPIRATORY PANEL BY PCR

## 2021-05-02 LAB — PHOSPHORUS: Phosphorus: 3.3 mg/dL (ref 2.5–4.6)

## 2021-05-02 LAB — BLOOD GAS, ARTERIAL
Acid-base deficit: 0.8 mmol/L (ref 0.0–2.0)
Bicarbonate: 23.5 mmol/L (ref 20.0–28.0)
Delivery systems: POSITIVE
Expiratory PAP: 5
FIO2: 0.35
Inspiratory PAP: 10
O2 Saturation: 97.5 %
Patient temperature: 37
pCO2 arterial: 37 mmHg (ref 32.0–48.0)
pH, Arterial: 7.41 (ref 7.350–7.450)
pO2, Arterial: 96 mmHg (ref 83.0–108.0)

## 2021-05-02 LAB — MRSA NEXT GEN BY PCR, NASAL: MRSA by PCR Next Gen: NOT DETECTED

## 2021-05-02 LAB — MAGNESIUM: Magnesium: 3 mg/dL — ABNORMAL HIGH (ref 1.7–2.4)

## 2021-05-02 LAB — RESP PANEL BY RT-PCR (FLU A&B, COVID) ARPGX2
Influenza A by PCR: NEGATIVE
Influenza B by PCR: NEGATIVE
SARS Coronavirus 2 by RT PCR: NEGATIVE

## 2021-05-02 LAB — TROPONIN I (HIGH SENSITIVITY)
Troponin I (High Sensitivity): 40 ng/L — ABNORMAL HIGH (ref <18)
Troponin I (High Sensitivity): 36 ng/L — ABNORMAL HIGH (ref <18)

## 2021-05-02 LAB — PROCALCITONIN: Procalcitonin: <0.1 ng/mL

## 2021-05-02 LAB — BRAIN NATRIURETIC PEPTIDE: B Natriuretic Peptide: 60.1 pg/mL (ref 0.0–100.0)

## 2021-05-02 MED ORDER — MAGNESIUM SULFATE 2 GM/50ML IV SOLN
2.0000 g | Freq: Once | INTRAVENOUS | Status: AC
  Administered 2021-05-02: 2 g via INTRAVENOUS
  Filled 2021-05-02: qty 50

## 2021-05-02 MED ORDER — LEVOTHYROXINE SODIUM 88 MCG PO TABS
88.0000 ug | ORAL_TABLET | Freq: Every day | ORAL | Status: DC
  Administered 2021-05-03 – 2021-05-06 (×4): 88 ug via ORAL
  Filled 2021-05-02 (×6): qty 1

## 2021-05-02 MED ORDER — PANTOPRAZOLE SODIUM 40 MG PO TBEC
40.0000 mg | DELAYED_RELEASE_TABLET | Freq: Every day | ORAL | Status: DC
  Administered 2021-05-03 – 2021-05-06 (×4): 40 mg via ORAL
  Filled 2021-05-02 (×5): qty 1

## 2021-05-02 MED ORDER — IPRATROPIUM-ALBUTEROL 0.5-2.5 (3) MG/3ML IN SOLN
3.0000 mL | Freq: Once | RESPIRATORY_TRACT | Status: AC
  Administered 2021-05-02: 3 mL via RESPIRATORY_TRACT

## 2021-05-02 MED ORDER — METHYLPREDNISOLONE SODIUM SUCC 125 MG IJ SOLR
120.0000 mg | Freq: Two times a day (BID) | INTRAMUSCULAR | Status: AC
Start: 2021-05-02 — End: 2021-05-03
  Administered 2021-05-02 – 2021-05-03 (×3): 120 mg via INTRAVENOUS
  Filled 2021-05-02 (×3): qty 2

## 2021-05-02 MED ORDER — ARFORMOTEROL TARTRATE 15 MCG/2ML IN NEBU
15.0000 ug | INHALATION_SOLUTION | Freq: Two times a day (BID) | RESPIRATORY_TRACT | Status: DC
  Administered 2021-05-02 – 2021-05-06 (×8): 15 ug via RESPIRATORY_TRACT
  Filled 2021-05-02 (×13): qty 2

## 2021-05-02 MED ORDER — SODIUM CHLORIDE 0.9 % IV SOLN
2.0000 g | Freq: Three times a day (TID) | INTRAVENOUS | Status: DC
  Administered 2021-05-02 – 2021-05-03 (×3): 2 g via INTRAVENOUS
  Filled 2021-05-02 (×3): qty 2

## 2021-05-02 MED ORDER — QUETIAPINE FUMARATE 200 MG PO TABS
400.0000 mg | ORAL_TABLET | Freq: Two times a day (BID) | ORAL | Status: DC
  Administered 2021-05-02 – 2021-05-06 (×8): 400 mg via ORAL
  Filled 2021-05-02 (×9): qty 2

## 2021-05-02 MED ORDER — ALPRAZOLAM 0.5 MG PO TABS: 1.0000 mg | ORAL_TABLET | Freq: Four times a day (QID) | ORAL | Status: DC

## 2021-05-02 MED ORDER — METHYLPREDNISOLONE SODIUM SUCC 40 MG IJ SOLR
40.0000 mg | Freq: Two times a day (BID) | INTRAMUSCULAR | Status: DC
Start: 2021-05-02 — End: 2021-05-02

## 2021-05-02 MED ORDER — ENOXAPARIN SODIUM 40 MG/0.4ML IJ SOSY
40.0000 mg | PREFILLED_SYRINGE | INTRAMUSCULAR | Status: DC
  Administered 2021-05-02 – 2021-05-05 (×4): 40 mg via SUBCUTANEOUS
  Filled 2021-05-02 (×4): qty 0.4

## 2021-05-02 MED ORDER — ADULT MULTIVITAMIN W/MINERALS CH
1.0000 | ORAL_TABLET | Freq: Every day | ORAL | Status: DC
  Administered 2021-05-03 – 2021-05-06 (×4): 1 via ORAL
  Filled 2021-05-02 (×4): qty 1

## 2021-05-02 MED ORDER — AZITHROMYCIN 500 MG IV SOLR
500.0000 mg | INTRAVENOUS | Status: DC
  Administered 2021-05-02 – 2021-05-03 (×2): 500 mg via INTRAVENOUS
  Filled 2021-05-02 (×3): qty 500

## 2021-05-02 MED ORDER — TIZANIDINE HCL 4 MG PO TABS
4.0000 mg | ORAL_TABLET | Freq: Three times a day (TID) | ORAL | Status: DC
  Administered 2021-05-02 – 2021-05-03 (×3): 4 mg via ORAL
  Filled 2021-05-02 (×6): qty 1

## 2021-05-02 MED ORDER — IPRATROPIUM-ALBUTEROL 0.5-2.5 (3) MG/3ML IN SOLN
RESPIRATORY_TRACT | Status: AC
  Filled 2021-05-02: qty 3

## 2021-05-02 MED ORDER — FLUOXETINE HCL 20 MG PO CAPS
40.0000 mg | ORAL_CAPSULE | Freq: Every day | ORAL | Status: DC
  Administered 2021-05-03 – 2021-05-06 (×4): 40 mg via ORAL
  Filled 2021-05-02 (×6): qty 2

## 2021-05-02 MED ORDER — UMECLIDINIUM-VILANTEROL 62.5-25 MCG/INH IN AEPB
1.0000 | INHALATION_SPRAY | Freq: Every day | RESPIRATORY_TRACT | Status: DC
  Filled 2021-05-02: qty 14

## 2021-05-02 MED ORDER — SODIUM CHLORIDE 3 % IN NEBU
4.0000 mL | INHALATION_SOLUTION | Freq: Every day | RESPIRATORY_TRACT | Status: AC
  Administered 2021-05-03 – 2021-05-04 (×2): 4 mL via RESPIRATORY_TRACT
  Filled 2021-05-02 (×3): qty 4

## 2021-05-02 MED ORDER — IPRATROPIUM-ALBUTEROL 0.5-2.5 (3) MG/3ML IN SOLN: 3.0000 mL | Freq: Four times a day (QID) | RESPIRATORY_TRACT | Status: DC

## 2021-05-02 MED ORDER — NICOTINE 14 MG/24HR TD PT24
14.0000 mg | MEDICATED_PATCH | Freq: Every day | TRANSDERMAL | Status: DC
  Administered 2021-05-02 – 2021-05-06 (×5): 14 mg via TRANSDERMAL
  Filled 2021-05-02 (×5): qty 1

## 2021-05-02 MED ORDER — NICOTINE 21 MG/24HR TD PT24: 21.0000 mg | MEDICATED_PATCH | Freq: Every day | TRANSDERMAL | Status: DC

## 2021-05-02 MED ORDER — ASCORBIC ACID 500 MG PO TABS
250.0000 mg | ORAL_TABLET | Freq: Two times a day (BID) | ORAL | Status: DC
  Administered 2021-05-02 – 2021-05-06 (×8): 250 mg via ORAL
  Filled 2021-05-02 (×8): qty 1

## 2021-05-02 MED ORDER — IPRATROPIUM-ALBUTEROL 0.5-2.5 (3) MG/3ML IN SOLN
3.0000 mL | RESPIRATORY_TRACT | Status: DC
  Administered 2021-05-02 (×6): 3 mL via RESPIRATORY_TRACT
  Filled 2021-05-02 (×7): qty 3

## 2021-05-02 MED ORDER — BUDESONIDE 0.5 MG/2ML IN SUSP
0.5000 mg | Freq: Two times a day (BID) | RESPIRATORY_TRACT | Status: DC
  Administered 2021-05-02 – 2021-05-06 (×9): 0.5 mg via RESPIRATORY_TRACT
  Filled 2021-05-02 (×9): qty 2

## 2021-05-02 MED ORDER — PREDNISONE 20 MG PO TABS: 40.0000 mg | ORAL_TABLET | Freq: Every day | ORAL | Status: DC

## 2021-05-02 MED ORDER — HYDRALAZINE HCL 20 MG/ML IJ SOLN
10.0000 mg | INTRAMUSCULAR | Status: DC | PRN
  Administered 2021-05-02: 10 mg via INTRAVENOUS
  Filled 2021-05-02: qty 1

## 2021-05-02 NOTE — ED Provider Notes (Signed)
[facility] Emergency Department Provider Note   ____________________________________________   Event Date/Time   First MD Initiated Contact with Patient 05/02/21 1030     (approximate)  I have reviewed the triage vital signs and the nursing notes.   HISTORY  Chief Complaint Respiratory Distress    HPI Doris Jenkins is a 65 y.o. female with past medical history of hypertension, COPD on 3 L, GERD, and bipolar disorder who presents to the ED complaining of shortness of breath.  Patient currently lives by herself and was found by one of her neighbors earlier today having significant difficulty breathing.  EMS was called and found patient in respiratory distress, in a tripod position.  She was placed on CPAP and given 125 mg of Solu-Medrol IV along with 2 DuoNeb's.  EMS reports that patient was moving very little air upon their arrival, after treatments they were able to auscultate some wheezing.  Patient is unable to provide significant amount of history given her respiratory distress.  When asked if she has having pain, patient states "I do not know."        Past Medical History:  Diagnosis Date   Bipolar disorder (HCC)    Hypertension    Hypothyroidism     Patient Active Problem List   Diagnosis Date Noted   COPD with acute exacerbation (HCC) 05/02/2021   Palliative care by specialist    SOB (shortness of breath)    Protein-calorie malnutrition, severe 04/23/2021   CAP (community acquired pneumonia) 04/22/2021   Sepsis (HCC) 04/22/2021   Acute hypoxemic respiratory failure (HCC) 02/14/2021   Acute respiratory failure with hypoxia (HCC) 02/13/2021   COPD exacerbation (HCC) 02/13/2021   Hypokalemia 02/13/2021   Encephalopathy 06/06/2016   Acute delirium 06/06/2016   Bipolar disorder (HCC) 06/06/2016   Neuropathic pain 05/09/2015   Essential hypertension 06/05/2014   Back pain, chronic 07/27/2013   COPD (chronic obstructive pulmonary disease)  (HCC) 07/27/2013   Hypothyroidism 07/27/2013    Past Surgical History:  Procedure Laterality Date   ABDOMINAL HYSTERECTOMY     BREAST BIOPSY N/A 80s    benign   OOPHORECTOMY     TOTAL THYROIDECTOMY      Prior to Admission medications   Medication Sig Start Date End Date Taking? Authorizing Provider  albuterol (ACCUNEB) 1.25 MG/3ML nebulizer solution Take 1.25 mg by nebulization 3 (three) times daily as needed for wheezing or shortness of breath.   Yes [provider]  albuterol (VENTOLIN HFA) 108 (90 Base) MCG/ACT inhaler Inhale 2 puffs into the lungs every 4 (four) hours as needed. 01/25/21  Yes [provider]  ALPRAZolam Prudy Feeler) 1 MG tablet Take 1 mg by mouth 4 (four) times daily. 03/31/21  Yes [provider]  ascorbic acid (VITAMIN C) 250 MG tablet Take 1 tablet (250 mg total) by mouth 2 (two) times daily. 04/26/21  Yes Arnetha Courser, MD  BREO ELLIPTA 100-25 MCG/INH AEPB Inhale 1 puff into the lungs daily. 12/25/20  Yes [provider]  EPINEPHrine 0.3 mg/0.3 mL IJ SOAJ injection Inject 0.3 mg into the muscle as needed. 06/21/15  Yes [provider]  FLUoxetine (PROZAC) 40 MG capsule Take 40 mg by mouth daily.   Yes [provider]  levothyroxine (SYNTHROID) 88 MCG tablet Take 88 mcg by mouth daily before breakfast.   Yes [provider]  Multiple Vitamin (MULTIVITAMIN WITH MINERALS) TABS tablet Take 1 tablet by mouth daily. 04/26/21  Yes Arnetha Courser, MD  naproxen (NAPROSYN)  500 MG tablet Take 500 mg by mouth 2 (two) times daily as needed. 02/04/21  Yes [provider]  omeprazole (PRILOSEC) 40 MG capsule Take 40 mg by mouth daily. 01/02/21  Yes [provider]  QUEtiapine (SEROQUEL) 400 MG tablet Take 400 mg by mouth 2 (two) times daily. 04/20/21  Yes [provider]  tiotropium (SPIRIVA HANDIHALER) 18 MCG inhalation capsule Place 1 capsule into inhaler and inhale daily. 06/21/15  Yes [provider]  tiZANidine (ZANAFLEX) 4 MG tablet Take 4 mg by mouth 3 (three) times daily. 02/17/21  Yes [provider]  gabapentin (NEURONTIN) 400 MG capsule Take 400 mg by mouth 4 (four) times daily. Patient not taking: Reported on 05/02/2021 04/28/21   [provider]  nicotine (NICODERM CQ - DOSED IN MG/24 HOURS) 14 mg/24hr patch Place 14 mg onto the skin daily. Patient not taking: Reported on 05/02/2021 11/19/20   [provider]    Allergies Bee venom, Sulfa antibiotics, Poison oak extract, and Sulfasalazine  Family History  Problem Relation Age of Onset   Cancer Mother    Heart attack Father     Social History Social History   Tobacco Use   Smoking status: Every Day    Types: Cigarettes   Smokeless tobacco: Never  Substance Use Topics   Alcohol use: No   Drug use: No    Review of Systems Unable to obtain secondary to respiratory distress ____________________________________________   PHYSICAL EXAM:  VITAL SIGNS: ED Triage Vitals [05/02/21 1027]  Enc Vitals Group     BP      Pulse      Resp      Temp      Temp src      SpO2      Weight 118 lb 2.7 oz (53.6 kg)     Height      Head Circumference      Peak Flow      Pain Score      Pain Loc      Pain Edu?      Excl. in GC?     Constitutional: Awake and alert. Eyes: Conjunctivae are normal. Head: Atraumatic. Nose: No congestion/rhinnorhea. Mouth/Throat: Mucous membranes are moist. Neck: Normal ROM Cardiovascular: Tachycardic, regular rhythm. Grossly normal heart sounds. Respiratory: Tachypneic with increased respiratory effort, accessory muscle use, and very poor air movement throughout with faint expiratory wheezing. Gastrointestinal: Soft and nontender. No distention. Genitourinary: deferred Musculoskeletal: No lower extremity tenderness nor edema. Neurologic:  Normal speech and language. No gross focal neurologic deficits are appreciated. Skin:  Skin is warm, dry and  intact. No rash noted. Psychiatric: Unable to assess.  ____________________________________________   LABS (all labs ordered are listed, but only abnormal results are displayed)  Labs Reviewed  BLOOD GAS, VENOUS - Abnormal; Notable for the following components:      Result Value   Bicarbonate 29.6 (*)    Acid-Base Excess 3.4 (*)    All other components within normal limits  CBC WITH DIFFERENTIAL/PLATELET - Abnormal; Notable for the following components:   WBC 20.9 (*)    Neutro Abs 18.6 (*)    Monocytes Absolute 1.1 (*)    Abs Immature Granulocytes 0.17 (*)    All other components within normal limits  COMPREHENSIVE METABOLIC PANEL - Abnormal; Notable for the following components:   Glucose, Bld 107 (*)    All other components within normal limits  TROPONIN I (HIGH SENSITIVITY) - Abnormal; Notable for the following components:  Troponin I (High Sensitivity) 40 (*)    All other components within normal limits  TROPONIN I (HIGH SENSITIVITY) - Abnormal; Notable for the following components:   Troponin I (High Sensitivity) 36 (*)    All other components within normal limits  RESP PANEL BY RT-PCR (FLU A&B, COVID) ARPGX2  RESPIRATORY PANEL BY PCR  MRSA NEXT GEN BY PCR, NASAL  EXPECTORATED SPUTUM ASSESSMENT W GRAM STAIN, RFLX TO RESP C  BRAIN NATRIURETIC PEPTIDE  PROCALCITONIN  BLOOD GAS, ARTERIAL  LEGIONELLA PNEUMOPHILA SEROGP 1 UR AG  STREP PNEUMONIAE URINARY ANTIGEN  BLOOD GAS, VENOUS   ____________________________________________  EKG  ED ECG REPORT I, Chesley Noon, the attending physician, personally viewed and interpreted this ECG.   Date: 05/02/2021  EKG Time: 10:31  Rate: 103  Rhythm: sinus tachycardia  Axis: RAD  Intervals:none  ST&T Change: Nonspecific T wave changes   PROCEDURES  Procedure(s) performed (including Critical Care):  .Critical Care  Date/Time: 05/02/2021 10:40 AM Performed by: Chesley Noon, MD Authorized by: Chesley Noon, MD    Critical care provider statement:    Critical care time (minutes):  45   Critical care time was exclusive of:  Separately billable procedures and treating other patients and teaching time   Critical care was necessary to treat or prevent imminent or life-threatening deterioration of the following conditions:  Respiratory failure   Critical care was time spent personally by me on the following activities:  Discussions with consultants, evaluation of patient's response to treatment, examination of patient, ordering and performing treatments and interventions, ordering and review of laboratory studies, ordering and review of radiographic studies, pulse oximetry, re-evaluation of patient's condition, obtaining history from patient or surrogate and review of old charts   I assumed direction of critical care for this patient from another provider in my specialty: no     Care discussed with: admitting provider     ____________________________________________   INITIAL IMPRESSION / ASSESSMENT AND PLAN / ED COURSE      65 year old female with past medical history of hypertension, COPD on 3 L, GERD, and bipolar disorder who presents to the ED for respiratory distress, found to be short of breath and tripoding by EMS.  She was placed on CPAP, treated with IV Solu-Medrol and duo nebs prior to arrival, appears to be clinically improving per EMS.  She was immediately transitioned to BiPAP upon arrival to the ED, continues to move very little air with auscultation of her lungs.  We will give additional DuoNeb, check labs and chest x-ray.  Suspect her symptoms are due to COPD exacerbation, VBG is pending.  EKG shows no evidence of arrhythmia or ischemia, low suspicion for ACS or PE.  Chest x-ray reviewed by me and shows no infiltrate, edema, or effusion.  VBG is reassuring without hypercapnic respiratory failure.  Troponin is mildly elevated but I suspect this is due to her respiratory distress.  Patient's  work of breathing appears to be gradually improving with BiPAP and additional breathing treatment.  Case discussed with hospitalist for admission.      ____________________________________________   FINAL CLINICAL IMPRESSION(S) / ED DIAGNOSES  Final diagnoses:  COPD exacerbation (HCC)  Acute on chronic respiratory failure with hypoxia [facility])     ED Discharge Orders     None        Note:  This document was prepared using Dragon voice recognition software and may include unintentional dictation errors.    Chesley Noon, MD 05/02/21 1524

## 2021-05-02 NOTE — ED Notes (Signed)
Rt to bedside to switch bipap mask from small to medium per pt request. See charted vitals.

## 2021-05-02 NOTE — Plan of Care (Signed)
Palliative: Thank you for this consult. Unfortunately due to high volume of consults there will be a delay in a Palliative Provider seeing this patient. Palliative Medicine will return to service on 8/22 and will see patient at that time.  No charge Ronrico Dupin, NP Palliative Medicine Please call Palliative Medicine team phone with any questions [phone]. For individual providers please see AMION. 

## 2021-05-02 NOTE — ED Notes (Addendum)
Bi pap discontinued at this time, pt placed on nasal cannula at 6 LPM per labs and verbal order from B Rust-Chester, NP  See charted vitals, pt appears to be tolerating [place] well - resting in bed with family present at bedside.

## 2021-05-02 NOTE — Consult Note (Signed)
NAME:  Doris Jenkins, MRN:  [id number], DOB:  09/08/1956, LOS: 0 ADMISSION DATE:  05/02/2021, CONSULTATION DATE:  05/02/21  REFERRING MD:  Linna Darner, CHIEF COMPLAINT:  Shortness of breath   History of Present Illness:  65 y.o. 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 Initial ED intake suggested poor air movement and use of accessory muscles. She was placed on BiPAP with some improvement in air hunger and Duo-Nebs and Solumedrol were given with some improvement. Initial blood gas showed pH 7.38 and pCO2 of 50. WBC is 20.9 with light left shift. Procalcitonin is undetectable. BNP is 60; there is no prior for review.  Pertinent  Medical History  Severe COPD Pulmonary emphysema Chronic hypoxic respiratory failure Tobacco abuse Bipolar disorder GERD  Significant Hospital Events: Including procedures, antibiotic start and stop dates in addition to other pertinent events   8/19: admitted to Hospitalist service  Interim History / Subjective:  N/A  Objective   Blood pressure (!) 150/99, pulse (!) 103, temperature 97.8 F (36.6 C), temperature source Oral, resp. rate (!) 29, weight 53.6 kg, SpO2 94 %.        Intake/Output Summary (Last 24 hours) at 05/02/2021 1315 Last data filed at 05/02/2021 1151 Gross per 24 hour  Intake 50 ml  Output --  Net 50 ml   Filed Weights   05/02/21 1027  Weight: 53.6 kg    Examination: General: Caucasian female in some distress, sitting upright HENT: BiPAP mask on, pupils round equal and reactive Lungs: significantly diminished throughout without wheezing, crackles or rhonchi, increased WOB Cardiovascular: tachycardic, no M/R/G Abdomen: soft, NTND, NABS Extremities: thin, no edema, no clubbing or cyanosis Neuro: awake, alert and oriented GU: deferred  SPIROMETRY from 2014: FVC was 2.42 liters, 79% of  predicted/Post 2.12, 70%, -12% Change FEV1 was 1.00, 41% of predicted/Post 0.97, 39%, -3% Change FEV1 ratio was 41/Post 46 FEF 25-75% liters per second was 15% of predicted/Post 16%, 13% Change *SVN given with 2.5 mg Albuterol for Post Spirometry.   LUNG VOLUMES: TLC was 120% of predicted RV was 190% of predicted   DIFFUSION CAPACITY: DLCO was 32% of predicted DLCO/VA was 35% of predicted   FLOW VOLUME LOOP: C/w severe obstruction   Impression Severe obstruction on spirometry Lung volumes show air trapping DLCO is severely decreased Compared to Previous Study, numbers are declined.  Resolved Hospital Problem list   N/A  Assessment & Plan:  Acute exacerbation of severe COPD, emphysema Acute on chronic hypoxic respiratory failure Active tobacco use  - Admit to Stepdown, primary service [facility] - No need for intubation at this time; will continue to assess - Continue BiPAP; repeat blood gas assessment - Increase frequency of Duo-Nebs to Q2H - Solumedrol 60 mg IV Q8H initially - Agree with antibiotics; add azithromycin x3d for antiinflammatory effect - Re-dose MgSO4 - Obtain RVP when she is able to come off BiPAP - Place nicotine patch - SpO2 goal 88-92%; wean O2 as able  Best Practice (right click and "Reselect all SmartList Selections" daily)   Diet/type: NPO DVT prophylaxis: LMWH GI prophylaxis: PPI Lines: N/A Foley:  N/A Code Status:  full code   Labs   CBC: Recent Labs  Lab 05/02/21 1031  WBC 20.9*  NEUTROABS 18.6*  HGB 14.6  HCT 42.2  MCV 95.0  PLT 349    Basic Metabolic Panel: Recent Labs  Lab 05/02/21 1031  NA 140  K 4.0  CL 102  CO2 28  GLUCOSE 107*  BUN 18  CREATININE 0.94  CALCIUM 9.8   GFR: Estimated Creatinine Clearance: 51.8 mL/min (by C-G formula based on SCr of 0.94 mg/dL). Recent Labs  Lab 05/02/21 1031  PROCALCITON <0.10  WBC 20.9*    Liver Function Tests: Recent Labs  Lab 05/02/21 1031  AST 25  ALT 25  ALKPHOS  79  BILITOT 1.1  PROT 7.3  ALBUMIN 4.2   No results for input(s): LIPASE, AMYLASE in the last 168 hours. No results for input(s): AMMONIA in the last 168 hours.  ABG    Component Value Date/Time   HCO3 29.6 (H) 05/02/2021 1028   O2SAT 43.7 05/02/2021 1028     Coagulation Profile: No results for input(s): INR, PROTIME in the last 168 hours.  Cardiac Enzymes: No results for input(s): CKTOTAL, CKMB, CKMBINDEX, TROPONINI in the last 168 hours.  HbA1C: Hgb A1c MFr Bld  Date/Time Value Ref Range Status  06/06/2016 12:23 AM 5.0 4.8 - 5.6 % Final    Comment:    (NOTE)         Pre-diabetes: 5.7 - 6.4         Diabetes: >6.4         Glycemic control for adults with diabetes: <7.0     CBG: No results for input(s): GLUCAP in the last 168 hours.  Review of Systems:   Pertinent findings noted in HPI. All systems reviewed and otherwise negative.  Past Medical History:  She,  has a past medical history of Bipolar disorder (HCC), Hypertension, and Hypothyroidism.   Surgical History:   Past Surgical History:  Procedure Laterality Date   ABDOMINAL HYSTERECTOMY     BREAST BIOPSY N/A 80s    benign   OOPHORECTOMY     TOTAL THYROIDECTOMY       Social History:   reports that she has been smoking cigarettes. She has never used smokeless tobacco. She reports that she does not drink alcohol and does not use drugs.   Family History:  Her family history includes Cancer in her mother; Heart attack in her father.   Allergies Allergies  Allergen Reactions   Bee Venom Anaphylaxis   Sulfa Antibiotics Itching, Rash and Shortness Of Breath    Redness    Poison Oak Extract     Other reaction(s): Unknown   Sulfasalazine Itching     Home Medications  Prior to Admission medications   Medication Sig Start Date End Date Taking? Authorizing Provider  albuterol (ACCUNEB) 1.25 MG/3ML nebulizer solution Take 1.25 mg by nebulization 3 (three) times daily as needed for wheezing or shortness  of breath.   Yes [provider]  albuterol (VENTOLIN HFA) 108 (90 Base) MCG/ACT inhaler Inhale 2 puffs into the lungs every 4 (four) hours as needed. 01/25/21  Yes [provider]  ALPRAZolam Prudy Feeler) 1 MG tablet Take 1 mg by mouth 4 (four) times daily. 03/31/21  Yes [provider]  ascorbic acid (VITAMIN C) 250 MG tablet Take 1 tablet (250 mg total) by mouth 2 (two) times daily. 04/26/21  Yes Arnetha Courser, MD  BREO ELLIPTA 100-25 MCG/INH AEPB Inhale 1 puff into the lungs daily. 12/25/20  Yes [provider]  EPINEPHrine 0.3 mg/0.3 mL IJ SOAJ injection Inject 0.3 mg into the muscle as needed. 06/21/15  Yes [provider]  FLUoxetine (PROZAC) 40 MG capsule Take 40 mg by mouth daily.   Yes  [provider]  levothyroxine (SYNTHROID) 88 MCG tablet Take 88 mcg by mouth daily before breakfast.   Yes [provider]  Multiple Vitamin (MULTIVITAMIN WITH MINERALS) TABS tablet Take 1 tablet by mouth daily. 04/26/21  Yes Arnetha Courser, MD  naproxen (NAPROSYN) 500 MG tablet Take 500 mg by mouth 2 (two) times daily as needed. 02/04/21  Yes [provider]  omeprazole (PRILOSEC) 40 MG capsule Take 40 mg by mouth daily. 01/02/21  Yes [provider]  QUEtiapine (SEROQUEL) 400 MG tablet Take 400 mg by mouth 2 (two) times daily. 04/20/21  Yes [provider]  tiotropium (SPIRIVA HANDIHALER) 18 MCG inhalation capsule Place 1 capsule into inhaler and inhale daily. 06/21/15  Yes [provider]  tiZANidine (ZANAFLEX) 4 MG tablet Take 4 mg by mouth 3 (three) times daily. 02/17/21  Yes [provider]  gabapentin (NEURONTIN) 400 MG capsule Take 400 mg by mouth 4 (four) times daily. Patient not taking: Reported on 05/02/2021 04/28/21   [provider]  nicotine (NICODERM CQ - DOSED IN MG/24 HOURS) 14 mg/24hr patch Place 14 mg onto the skin daily. Patient not taking: Reported on 05/02/2021 11/19/20   [provider]     Critical care time: 45 minutes

## 2021-05-02 NOTE — ED Triage Notes (Signed)
Pt to ED [facility] from home. Resp distress. Wears 3L [place] at home chronic, 93% on 3L. Pt tripod, hunched over, reports chest pain.  Placed on bipap on arrival. 2 duonebs and 125 solumedrol given PTA.  20g to Left AC PTA

## 2021-05-02 NOTE — H&P (Addendum)
History and Physical    Doris Jenkins HCW:[id number] DOB: 1956/06/11 DOA: 05/02/2021  PCP: Pcp, No  Chief Complaint: Shortness of breath  HPI: Doris Jenkins is a 65 y.o. female with a past medical history of COPD with chronic hypoxic respiratory failure on 3 L oxygen at baseline, bipolar disorder, depression/anxiety, tobacco dependence, GERD.  Most of the history is provided by the niece at bedside.  Son has stepped out the emergency room currently to make some phone calls.  The patient was not answering her phone this morning therefore family got in touch with her neighbors and when they checked on her she was found to be in severe respiratory distress.  When EMS arrived she was tripoding and hunched down.  She was given 2 DuoNeb treatments and 125 mg dose of Solu-Medrol prior to arrival.  Placed on CPAP in route.  In the emergency department she was placed on BiPAP and tachypnea improved somewhat from the 40s to now low 30s.  Still not moving much air.  Using accessory muscles.  Recently hospitalized for pneumonia.  She was previously DNR but discussing with her at bedside she states she would be okay with intubation if necessary.  Currently on 10/5 at 35% FiO2 BiPAP settings.  ED Course: Chest x-ray, flu A/B PCR, COVID 19 PCR, VBG, CBC, troponins, procalcitonin, CMP, BNP.  DuoNeb 3 mL x 1, mag sulfate 2 g x 1, EKG.  Review of Systems: 14 point review of systems is negative except for what is mentioned above in the HPI.   Past Medical History:  Diagnosis Date   Bipolar disorder (HCC)    Hypertension    Hypothyroidism     Past Surgical History:  Procedure Laterality Date   ABDOMINAL HYSTERECTOMY     BREAST BIOPSY N/A 80s    benign   OOPHORECTOMY     TOTAL THYROIDECTOMY      Social History   Socioeconomic History   Marital status: Single    Spouse name: Not on file   Number of children: Not on file   Years of education: Not on file   Highest education level: Not on file   Occupational History   Not on file  Tobacco Use   Smoking status: Every Day    Types: Cigarettes   Smokeless tobacco: Never  Substance and Sexual Activity   Alcohol use: No   Drug use: No   Sexual activity: Yes    Birth control/protection: None  Other Topics Concern   Not on file  Social History Narrative   Not on file   Social Determinants of Health   Financial Resource Strain: Not on file  Food Insecurity: Not on file  Transportation Needs: Not on file  Physical Activity: Not on file  Stress: Not on file  Social Connections: Not on file  Intimate Partner Violence: Not on file    Allergies  Allergen Reactions   Bee Venom Anaphylaxis   Sulfa Antibiotics Itching, Rash and Shortness Of Breath    Redness    Poison Oak Extract     Other reaction(s): Unknown   Sulfasalazine Itching    Family History  Problem Relation Age of Onset   Cancer Mother    Heart attack Father     Prior to Admission medications   Medication Sig Start Date End Date Taking? Authorizing Provider  albuterol (ACCUNEB) 1.25 MG/3ML nebulizer solution Take 1.25 mg by nebulization 3 (three) times daily as needed for wheezing or shortness of breath.  Yes [provider]  albuterol (VENTOLIN HFA) 108 (90 Base) MCG/ACT inhaler Inhale 2 puffs into the lungs every 4 (four) hours as needed. 01/25/21  Yes [provider]  ALPRAZolam Prudy Feeler) 1 MG tablet Take 1 mg by mouth 4 (four) times daily. 03/31/21  Yes [provider]  ascorbic acid (VITAMIN C) 250 MG tablet Take 1 tablet (250 mg total) by mouth 2 (two) times daily. 04/26/21  Yes Arnetha Courser, MD  BREO ELLIPTA 100-25 MCG/INH AEPB Inhale 1 puff into the lungs daily. 12/25/20  Yes [provider]  EPINEPHrine 0.3 mg/0.3 mL IJ SOAJ injection Inject 0.3 mg into the muscle as needed. 06/21/15  Yes [provider]  FLUoxetine (PROZAC) 40 MG capsule Take 40 mg by mouth daily.   Yes [provider]   levothyroxine (SYNTHROID) 88 MCG tablet Take 88 mcg by mouth daily before breakfast.   Yes [provider]  Multiple Vitamin (MULTIVITAMIN WITH MINERALS) TABS tablet Take 1 tablet by mouth daily. 04/26/21  Yes Arnetha Courser, MD  naproxen (NAPROSYN) 500 MG tablet Take 500 mg by mouth 2 (two) times daily as needed. 02/04/21  Yes [provider]  omeprazole (PRILOSEC) 40 MG capsule Take 40 mg by mouth daily. 01/02/21  Yes [provider]  QUEtiapine (SEROQUEL) 400 MG tablet Take 400 mg by mouth 2 (two) times daily. 04/20/21  Yes [provider]  tiotropium (SPIRIVA HANDIHALER) 18 MCG inhalation capsule Place 1 capsule into inhaler and inhale daily. 06/21/15  Yes [provider]  tiZANidine (ZANAFLEX) 4 MG tablet Take 4 mg by mouth 3 (three) times daily. 02/17/21  Yes [provider]  gabapentin (NEURONTIN) 400 MG capsule Take 400 mg by mouth 4 (four) times daily. Patient not taking: Reported on 05/02/2021 04/28/21   [provider]  nicotine (NICODERM CQ - DOSED IN MG/24 HOURS) 14 mg/24hr patch Place 14 mg onto the skin daily. Patient not taking: Reported on 05/02/2021 11/19/20   [provider]    Physical Exam: Vitals:   05/02/21 1031 05/02/21 1045 05/02/21 1110 05/02/21 1200  BP: (!) 149/105 (!) 154/105 (!) 154/104 (!) 150/99  Pulse: (!) 105 (!) 108 (!) 105 (!) 103  Resp: (!) 40 (!) 26 (!) 32 (!) 29  Temp: 97.8 F (36.6 C)     TempSrc: Oral     SpO2: 96% 97% 98% 94%  Weight:         General: Moderate respiratory distress with accessory muscle use, chronically ill-appearing. Cardiovascular:  RRR, no m/r/g.  Respiratory:   Diminished breath sounds not moving much air. Abdomen:  soft, NT, ND, NABS Skin:  no rash or induration seen on limited exam Musculoskeletal:  grossly normal tone BUE/BLE, good ROM, no bony abnormality Lower extremity:  No LE edema.  Limited foot exam with no ulcerations.  2+ distal pulses. Psychiatric:   grossly normal mood and affect, speech fluent and appropriate, Aox2 to name, place. Neurologic:  CN 2-12 grossly intact, moves all extremities in coordinated fashion, sensation intact    Radiological Exams on Admission: Independently reviewed - see discussion in A/P where applicable  DG Chest Portable 1 View  Result Date: 05/02/2021 CLINICAL DATA:  Shortness of breath and respiratory distress. Home oxygen therapy. Chest pain. EXAM: PORTABLE CHEST 1 VIEW COMPARISON:  04/22/2021 FINDINGS: Severe emphysema. Slightly improved aeration at the site of prior bibasilar airspace opacities. These airspace opacities favor the periphery of the lung bases. Left axillary and breast clips.  Heart size within normal  limits. Thoracic kyphosis. IMPRESSION: 1. Mildly improved aeration at the site of the bibasilar airspace opacities favoring mild improvement in multilobar pneumonia. 2. Severe emphysema. 3. Thoracic kyphosis. Electronically Signed   By: Gaylyn Rong M.D.   On: 05/02/2021 11:24    EKG: Independently reviewed.  Sinus tachycardia rate 103.   Labs on Admission: I have personally reviewed the available labs and imaging studies at the time of the admission.  Pertinent labs: WBC 20, troponin 40, blood glucose 107; VBG: pH 7.38, PCO2 50 on BiPAP at 35% FiO2.     Assessment/Plan: Acute on chronic hypoxic hypercapnic respiratory failure secondary to severe COPD exacerbation: This patient will be admitted to the stepdown unit under inpatient status with cardiac monitoring.  Currently on BiPAP at 35% FiO2 with some improvement in her respiratory distress than initial presentation.  We will order a repeat VBG for later this evening.  Close monitoring and low threshold for intubation.  Aggressive pulmonary toilet.  Start IV steroids, IV antibiotics, DuoNeb scheduled and as needed.  Hypertonic nebs scheduled.  Ordered sputum culture, respiratory panel, urine Legionella and strep antigen.  Wean oxygen as  tolerated. Flu A/B PCR was negative.  COVID-19 PCR was negative.  ICS, flutter valve, chest PT.  Ordered Anoro Ellipta.  Home Breo and Spiriva currently on hold. Will consult PCCM.  Chest X-ray showed improvement in her multilobar pneumonia from previous hospitalization.  Chest pain likely secondary above: EKG showed no acute ischemic changes.  Troponins mildly elevated likely secondary to demand ischemia.  Low suspicion for ACS.  Tobacco dependence: Counseled on tobacco cessation.  Start nicotine patch 14 mg daily.  Depression/anxiety: Continue home Prozac 40 mg daily and hold home Xanax 1 mg 4 times daily.  Hypothyroidism: Continue home Synthroid 88 mcg daily.  Bipolar disorder: Continue home Seroquel 400 mg twice daily.  GERD: Continue home Prilosec 40 mg daily.   Level of Care: Stepdown DVT prophylaxis: Lovenox subcu Code Status: Full code Consults: PCCM Admission status: Inpatient   Verdia Kuba DO [facility]   How to contact the [facility] Attending or Consulting provider 7A - 7P or covering provider during after hours 7P -7A, for this patient?  Check the care team in [facility] and look for a) attending/consulting [facility] provider listed and b) the [facility] team listed Log into [email or web address] and use [place]'s universal password to access. If you do not have the password, please contact the hospital operator. Locate the [facility] provider you are looking for under [facility] and page to a number that you can be directly reached. If you still have difficulty reaching the provider, please page the [facility] (Director on Call) for the Hospitalists listed on amion for assistance.   05/02/2021, 12:48 PM

## 2021-05-03 ENCOUNTER — Other Ambulatory Visit: Payer: Self-pay

## 2021-05-03 ENCOUNTER — Encounter: Payer: Self-pay | Admitting: Family Medicine

## 2021-05-03 DIAGNOSIS — J441 Chronic obstructive pulmonary disease with (acute) exacerbation: Secondary | ICD-10-CM | POA: Diagnosis not present

## 2021-05-03 DIAGNOSIS — J9621 Acute and chronic respiratory failure with hypoxia: Secondary | ICD-10-CM | POA: Diagnosis not present

## 2021-05-03 LAB — BASIC METABOLIC PANEL
Anion gap: 11 (ref 5–15)
BUN: 31 mg/dL — ABNORMAL HIGH (ref 8–23)
CO2: 24 mmol/L (ref 22–32)
Calcium: 9 mg/dL (ref 8.9–10.3)
Chloride: 101 mmol/L (ref 98–111)
Creatinine, Ser: 0.89 mg/dL (ref 0.44–1.00)
GFR, Estimated: >60 mL/min (ref >60)
Glucose, Bld: 100 mg/dL — ABNORMAL HIGH (ref 70–99)
Potassium: 3.6 mmol/L (ref 3.5–5.1)
Sodium: 136 mmol/L (ref 135–145)

## 2021-05-03 LAB — CBC WITH DIFFERENTIAL/PLATELET
Abs Immature Granulocytes: 0.08 10*3/uL — ABNORMAL HIGH (ref 0.00–0.07)
Basophils Absolute: 0 10*3/uL (ref 0.0–0.1)
Basophils Relative: 0 %
Eosinophils Absolute: 0 10*3/uL (ref 0.0–0.5)
Eosinophils Relative: 0 %
HCT: 37.9 % (ref 36.0–46.0)
Hemoglobin: 13.4 g/dL (ref 12.0–15.0)
Immature Granulocytes: 1 %
Lymphocytes Relative: 2 %
Lymphs Abs: 0.4 10*3/uL — ABNORMAL LOW (ref 0.7–4.0)
MCH: 33.8 pg (ref 26.0–34.0)
MCHC: 35.4 g/dL (ref 30.0–36.0)
MCV: 95.5 fL (ref 80.0–100.0)
Monocytes Absolute: 0.3 10*3/uL (ref 0.1–1.0)
Monocytes Relative: 2 %
Neutro Abs: 15.6 10*3/uL — ABNORMAL HIGH (ref 1.7–7.7)
Neutrophils Relative %: 95 %
Platelets: 258 10*3/uL (ref 150–400)
RBC: 3.97 MIL/uL (ref 3.87–5.11)
RDW: 14.9 % (ref 11.5–15.5)
WBC: 16.3 10*3/uL — ABNORMAL HIGH (ref 4.0–10.5)
nRBC: 0 % (ref 0.0–0.2)

## 2021-05-03 LAB — PHOSPHORUS: Phosphorus: 2.7 mg/dL (ref 2.5–4.6)

## 2021-05-03 LAB — MAGNESIUM: Magnesium: 2.7 mg/dL — ABNORMAL HIGH (ref 1.7–2.4)

## 2021-05-03 MED ORDER — IPRATROPIUM-ALBUTEROL 0.5-2.5 (3) MG/3ML IN SOLN
3.0000 mL | RESPIRATORY_TRACT | Status: DC
  Administered 2021-05-03 (×2): 3 mL via RESPIRATORY_TRACT
  Filled 2021-05-03 (×2): qty 3

## 2021-05-03 MED ORDER — ENSURE ENLIVE PO LIQD: 237.0000 mL | Freq: Two times a day (BID) | ORAL | Status: DC

## 2021-05-03 MED ORDER — ALPRAZOLAM 0.5 MG PO TABS
1.0000 mg | ORAL_TABLET | Freq: Four times a day (QID) | ORAL | Status: DC | PRN
  Administered 2021-05-03 – 2021-05-04 (×2): 1 mg via ORAL
  Filled 2021-05-03 (×2): qty 2

## 2021-05-03 MED ORDER — IPRATROPIUM-ALBUTEROL 0.5-2.5 (3) MG/3ML IN SOLN
3.0000 mL | Freq: Four times a day (QID) | RESPIRATORY_TRACT | Status: DC
  Administered 2021-05-03 – 2021-05-06 (×11): 3 mL via RESPIRATORY_TRACT
  Filled 2021-05-03 (×11): qty 3

## 2021-05-03 MED ORDER — PREDNISONE 20 MG PO TABS
40.0000 mg | ORAL_TABLET | Freq: Every day | ORAL | Status: DC
  Administered 2021-05-04 – 2021-05-05 (×2): 40 mg via ORAL
  Filled 2021-05-03 (×2): qty 2

## 2021-05-03 MED ORDER — TIZANIDINE HCL 4 MG PO TABS
4.0000 mg | ORAL_TABLET | Freq: Three times a day (TID) | ORAL | Status: DC | PRN
  Filled 2021-05-03: qty 1

## 2021-05-03 MED ORDER — AZITHROMYCIN 250 MG PO TABS: 250.0000 mg | ORAL_TABLET | Freq: Every day | ORAL | Status: DC

## 2021-05-03 NOTE — ED Notes (Signed)
Pt removed O2, tried to leave. Informed of importance of staying, inability to breathe.

## 2021-05-03 NOTE — ED Notes (Addendum)
Received verbal orders from NP Cheryll Cockayne to trial patient on 4L via nasal cannula.

## 2021-05-03 NOTE — Progress Notes (Signed)
NAME:  XOCHILT CONANT, MRN:  [id number], DOB:  March 01, 1956, LOS: 1 ADMISSION DATE:  05/02/2021, CONSULTATION DATE:  05/03/21  REFERRING MD:  Linna Darner, CHIEF COMPLAINT:  Shortness of breath   History of Present Illness:  65 y.o. 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 Initial ED intake suggested poor air movement and use of accessory muscles. She was placed on BiPAP with some improvement in air hunger and Duo-Nebs and Solumedrol were given with some improvement. Initial blood gas showed pH 7.38 and pCO2 of 50. WBC is 20.9 with light left shift. Procalcitonin is undetectable. BNP is 60; there is no prior for review.  Pertinent  Medical History  Severe COPD Pulmonary emphysema Chronic hypoxic respiratory failure Tobacco abuse Bipolar disorder GERD  Significant Hospital Events: Including procedures, antibiotic start and stop dates in addition to other pertinent events   8/19: admitted to Hospitalist service  Interim History / Subjective:  Patient appears much better this AM. She is speaking in full sentences and reports significant symptomatic improvement. Leukocytosis downtrending. On 4L/min with sats in the mid-90s; she uses 3L/min O2 at home.  Objective   Blood pressure (!) 137/100, pulse (!) 108, temperature 97.8 F (36.6 C), temperature source Oral, resp. rate (!) 24, weight 53.6 kg, SpO2 95 %.    FiO2 (%):  [35 %] 35 %   Intake/Output Summary (Last 24 hours) at 05/03/2021 2536 Last data filed at 05/02/2021 2215 Gross per 24 hour  Intake 250 ml  Output --  Net 250 ml   Filed Weights   05/02/21 1027  Weight: 53.6 kg    Examination: General: Caucasian female in NAD Lungs: significantly decreased air movement in b/l lower lobes, no W/C/R Cardiovascular: RRR, no M/R/G Abdomen: soft, NTND, NABS Extremities: thin, no edema, no clubbing  or cyanosis Neuro: awake, alert and oriented  SPIROMETRY from 2014: FVC was 2.42 liters, 79% of predicted/Post 2.12, 70%, -12% Change FEV1 was 1.00, 41% of predicted/Post 0.97, 39%, -3% Change FEV1 ratio was 41/Post 46 FEF 25-75% liters per second was 15% of predicted/Post 16%, 13% Change *SVN given with 2.5 mg Albuterol for Post Spirometry.   LUNG VOLUMES: TLC was 120% of predicted RV was 190% of predicted   DIFFUSION CAPACITY: DLCO was 32% of predicted DLCO/VA was 35% of predicted   FLOW VOLUME LOOP: C/w severe obstruction   Impression Severe obstruction on spirometry Lung volumes show air trapping DLCO is severely decreased Compared to Previous Study, numbers are declined.  Resolved Hospital Problem list   N/A  Assessment & Plan:  Acute exacerbation of severe COPD, emphysema Acute on chronic hypoxic respiratory failure Active tobacco use  - Duo-Nebs q4H - Can continued Solumedrol 60 mg IV Q8H through today then transition to prednisone 40 mg daily to complete 5 day course - Agree with antibiotics; add azithromycin for antiinflammatory effect and continued 250 mg daily on discharge - Place nicotine patch - SpO2 goal 88-92%; wean O2 as able  PCCM will sign off. Thank you for the consultation.  Best Practice (right click and "Reselect all SmartList Selections" daily)   Diet/type: Regular consistency (see orders) DVT prophylaxis: LMWH GI prophylaxis: PPI Lines: N/A Foley:  N/A Code Status:  full code   Labs   CBC: Recent Labs  Lab 05/02/21 1031 05/03/21 0645  WBC 20.9* 16.3*  NEUTROABS 18.6* 15.6*  HGB 14.6 13.4  HCT 42.2 37.9  MCV 95.0 95.5  PLT 349 258    Basic Metabolic Panel: Recent Labs  Lab 05/02/21 1031 05/02/21 2136 05/03/21 0645  NA 140  --  136  K 4.0  --  3.6  CL 102  --  101  CO2 28  --  24  GLUCOSE 107*  --  100*  BUN 18  --  31*  CREATININE 0.94  --  0.89  CALCIUM 9.8  --  9.0  MG  --  3.0* 2.7*  PHOS  --  3.3 2.7    GFR: Estimated Creatinine Clearance: 54.7 mL/min (by C-G formula based on SCr of 0.89 mg/dL). Recent Labs  Lab 05/02/21 1031 05/03/21 0645  PROCALCITON <0.10  --   WBC 20.9* 16.3*    Liver Function Tests: Recent Labs  Lab 05/02/21 1031  AST 25  ALT 25  ALKPHOS 79  BILITOT 1.1  PROT 7.3  ALBUMIN 4.2   No results for input(s): LIPASE, AMYLASE in the last 168 hours. No results for input(s): AMMONIA in the last 168 hours.  ABG    Component Value Date/Time   PHART 7.41 05/02/2021 1329   PCO2ART 37 05/02/2021 1329   PO2ART 96 05/02/2021 1329   HCO3 22.6 05/02/2021 2200   ACIDBASEDEF 0.2 05/02/2021 2200   O2SAT 97.5 05/02/2021 2200     Coagulation Profile: No results for input(s): INR, PROTIME in the last 168 hours.  Cardiac Enzymes: No results for input(s): CKTOTAL, CKMB, CKMBINDEX, TROPONINI in the last 168 hours.  HbA1C: Hgb A1c MFr Bld  Date/Time Value Ref Range Status  06/06/2016 12:23 AM 5.0 4.8 - 5.6 % Final    Comment:    (NOTE)         Pre-diabetes: 5.7 - 6.4         Diabetes: >6.4         Glycemic control for adults with diabetes: <7.0     CBG: No results for input(s): GLUCAP in the last 168 hours.  Review of Systems:   Pertinent findings noted in HPI. All systems reviewed and otherwise negative.  Past Medical History:  She,  has a past medical history of Bipolar disorder (HCC), Hypertension, and Hypothyroidism.   Surgical History:   Past Surgical History:  Procedure Laterality Date   ABDOMINAL HYSTERECTOMY     BREAST BIOPSY N/A 80s    benign   OOPHORECTOMY     TOTAL THYROIDECTOMY       Social History:   reports that she has been smoking cigarettes. She has never used smokeless tobacco. She reports that she does not drink alcohol and does not use drugs.   Family History:  Her family history includes Cancer in her mother; Heart attack in her father.   Allergies Allergies  Allergen Reactions   Bee Venom Anaphylaxis   Sulfa  Antibiotics Itching, Rash and Shortness Of Breath    Redness    Poison Oak Extract     Other reaction(s): Unknown   Sulfasalazine Itching     Home Medications  Prior to Admission medications   Medication Sig Start Date End Date Taking? Authorizing Provider  albuterol (ACCUNEB) 1.25 MG/3ML nebulizer solution Take 1.25 mg by nebulization 3 (three) times daily as needed for wheezing or shortness of breath.   Yes [provider]  albuterol (VENTOLIN HFA) 108 (90 Base) MCG/ACT inhaler Inhale 2 puffs into the lungs every 4 (four) hours as needed. 01/25/21  Yes [provider]  ALPRAZolam Prudy Feeler) 1  MG tablet Take 1 mg by mouth 4 (four) times daily. 03/31/21  Yes [provider]  ascorbic acid (VITAMIN C) 250 MG tablet Take 1 tablet (250 mg total) by mouth 2 (two) times daily. 04/26/21  Yes Arnetha Courser, MD  BREO ELLIPTA 100-25 MCG/INH AEPB Inhale 1 puff into the lungs daily. 12/25/20  Yes [provider]  EPINEPHrine 0.3 mg/0.3 mL IJ SOAJ injection Inject 0.3 mg into the muscle as needed. 06/21/15  Yes [provider]  FLUoxetine (PROZAC) 40 MG capsule Take 40 mg by mouth daily.   Yes [provider]  levothyroxine (SYNTHROID) 88 MCG tablet Take 88 mcg by mouth daily before breakfast.   Yes [provider]  Multiple Vitamin (MULTIVITAMIN WITH MINERALS) TABS tablet Take 1 tablet by mouth daily. 04/26/21  Yes Arnetha Courser, MD  naproxen (NAPROSYN) 500 MG tablet Take 500 mg by mouth 2 (two) times daily as needed. 02/04/21  Yes [provider]  omeprazole (PRILOSEC) 40 MG capsule Take 40 mg by mouth daily. 01/02/21  Yes [provider]  QUEtiapine (SEROQUEL) 400 MG tablet Take 400 mg by mouth 2 (two) times daily. 04/20/21  Yes [provider]  tiotropium (SPIRIVA HANDIHALER) 18 MCG inhalation capsule Place 1 capsule into inhaler and inhale daily. 06/21/15  Yes [provider]  tiZANidine (ZANAFLEX) 4 MG tablet Take  4 mg by mouth 3 (three) times daily. 02/17/21  Yes [provider]  gabapentin (NEURONTIN) 400 MG capsule Take 400 mg by mouth 4 (four) times daily. Patient not taking: Reported on 05/02/2021 04/28/21   [provider]  nicotine (NICODERM CQ - DOSED IN MG/24 HOURS) 14 mg/24hr patch Place 14 mg onto the skin daily. Patient not taking: Reported on 05/02/2021 11/19/20   [provider]     Marcelo Baldy, MD 05/03/21 9:04 AM

## 2021-05-03 NOTE — Progress Notes (Signed)
Pt admitted to room 151 from ED for COPD exacerbation. Pt oriented to room and call bell in reach.   05/03/21 1532  Vitals  Temp 97.8 F (36.6 C)  Temp Source Oral  BP (!) 144/95  BP Location Right Arm  BP Method Automatic  Patient Position (if appropriate) Lying  Pulse Rate (!) 114  Pulse Rate Source Monitor  Resp 20  Level of Consciousness  Level of Consciousness Alert  MEWS COLOR  MEWS Score Color Yellow  Oxygen Therapy  SpO2 92 %  O2 Device Nasal Cannula  O2 Flow Rate (L/min) 4 L/min

## 2021-05-03 NOTE — ED Notes (Signed)
Gave pt her cell phone so she can call son, as well as glasses.

## 2021-05-03 NOTE — Plan of Care (Signed)

## 2021-05-03 NOTE — ED Notes (Signed)
Went into room to introduce self to pt, pt had removed IV in her sleep, as well as nasal cannula. Pt was satting at approx 86% on RA. Placed pt back on 4L, pt improved to mid 90s.

## 2021-05-03 NOTE — Progress Notes (Signed)
PROGRESS NOTE    Doris Jenkins  WVP:[id number] DOB: 01-20-1956 DOA: 05/02/2021 PCP: Pcp, No  151A/151A-AA   Assessment & Plan:   Active Problems:   COPD with acute exacerbation (HCC)   Doris Jenkins is a 65 y.o. female with a past medical history of COPD with chronic hypoxic respiratory failure on 3 L oxygen at baseline, bipolar disorder, depression/anxiety, current smoker.   Pt was found to be in severe respiratory distress.  son added later that pt's O2 tank was empty.  When EMS arrived she was tripoding and hunched down.  She was given 2 DuoNeb treatments and 125 mg dose of Solu-Medrol prior to arrival.  Placed on CPAP in route.  In the emergency department she was placed on BiPAP.  Pt's respiratory status improved quickly and was already back down to 4L next morning.     Acute on chronic hypoxic hypercapnic respiratory failure 2/2 # running out of supplemental oxygen # Severe COPD on chronic 3L O2 at baseline --pt was found in respiratory distress, and son noted oxygen tank was empty.  Pt's respiratory status improved very quickly and was back to almost baseline the next morning after BiPAP and supplementation of oxygen.  On exam, pt was without any wheezes.  No coughing or sputum production.  Pt's respiratory distress was likely due to hypoxia from not getting her usual 3L supplemental oxygen, not from COPD exacerbation.  --Chest X-ray showed improvement in her multilobar pneumonia from previous hospitalization.  --started on azithromycin, cefepime, solumedrol and scheduled nebs Plan: --d/c abx --quickly taper off steroid --cont nebs --Continue supplemental O2 to keep sats between 88-92%, wean as tolerated  # Confusion and audio hallucination 2/2 Hospital delirium --monitor and frequent orientation and reassurance   Mild trop elevation 2/2 demand ischemia EKG showed no acute ischemic changes.  trop 40 and 36.   Current smoker Counseled on tobacco cessation.   --cont  nicotine patch   Depression/anxiety:  Continue home Prozac 40 mg daily  --cont home Xanax 1 mg q6h PRN (current Rx)   Hypothyroidism:  Continue home Synthroid 88 mcg daily.   Bipolar disorder:  Continue home Seroquel 400 mg twice daily.   GERD:  --cont home PPI   DVT prophylaxis: Lovenox SQ Code Status: Full code  Family Communication: son updated at bedside today Level of care: Med-Surg Dispo:   The patient is from: home Anticipated d/c is to: undetermined Anticipated d/c date is: 1-2 days Patient currently is not medically ready to d/c due to: just weaned off BiPAP, need PT eval   Subjective and Interval History:  Pt reported feeling better.  Kept complaining about hearing alarm beeping (alarm wasn't beeping).    Pt was quickly weaned off BiPAP and to 4L [place], so send to MedSurg instead of stepdown.   Objective: Vitals:   05/03/21 1430 05/03/21 1500 05/03/21 1532 05/03/21 1632  BP: 139/89 132/87 (!) 144/95   Pulse: (!) 102 (!) 108 (!) 107   Resp: 20 19 20    Temp:   97.8 F (36.6 C)   TempSrc:   Oral   SpO2: 99% 97% 92%   Weight:    53.6 kg  Height:    5' 4.02" (1.626 m)    Intake/Output Summary (Last 24 hours) at 05/03/2021 1810 Last data filed at 05/02/2021 2215 Gross per 24 hour  Intake 100 ml  Output --  Net 100 ml   Filed Weights   05/02/21 1027 05/03/21 1632  Weight: 53.6 kg 53.6  kg    Examination:   Constitutional: NAD, alert, oriented to person and place HEENT: conjunctivae and lids normal, EOMI CV: No cyanosis.   RESP: normal respiratory effort, no wheezes, on 4L Extremities: No effusions, edema in BLE SKIN: warm, dry Neuro: II - XII grossly intact.   Psych: Normal mood and affect.     Data Reviewed: I have personally reviewed following labs and imaging studies  CBC: Recent Labs  Lab 05/02/21 1031 05/03/21 0645  WBC 20.9* 16.3*  NEUTROABS 18.6* 15.6*  HGB 14.6 13.4  HCT 42.2 37.9  MCV 95.0 95.5  PLT 349 258   Basic Metabolic  Panel: Recent Labs  Lab 05/02/21 1031 05/02/21 2136 05/03/21 0645  NA 140  --  136  K 4.0  --  3.6  CL 102  --  101  CO2 28  --  24  GLUCOSE 107*  --  100*  BUN 18  --  31*  CREATININE 0.94  --  0.89  CALCIUM 9.8  --  9.0  MG  --  3.0* 2.7*  PHOS  --  3.3 2.7   GFR: Estimated Creatinine Clearance: 54.7 mL/min (by C-G formula based on SCr of 0.89 mg/dL). Liver Function Tests: Recent Labs  Lab 05/02/21 1031  AST 25  ALT 25  ALKPHOS 79  BILITOT 1.1  PROT 7.3  ALBUMIN 4.2   No results for input(s): LIPASE, AMYLASE in the last 168 hours. No results for input(s): AMMONIA in the last 168 hours. Coagulation Profile: No results for input(s): INR, PROTIME in the last 168 hours. Cardiac Enzymes: No results for input(s): CKTOTAL, CKMB, CKMBINDEX, TROPONINI in the last 168 hours. BNP (last 3 results) No results for input(s): PROBNP in the last 8760 hours. HbA1C: No results for input(s): HGBA1C in the last 72 hours. CBG: No results for input(s): GLUCAP in the last 168 hours. Lipid Profile: No results for input(s): CHOL, HDL, LDLCALC, TRIG, CHOLHDL, LDLDIRECT in the last 72 hours. Thyroid Function Tests: No results for input(s): TSH, T4TOTAL, FREET4, T3FREE, THYROIDAB in the last 72 hours. Anemia Panel: No results for input(s): VITAMINB12, FOLATE, FERRITIN, TIBC, IRON, RETICCTPCT in the last 72 hours. Sepsis Labs: Recent Labs  Lab 05/02/21 1031  PROCALCITON <0.10    Recent Results (from the past 240 hour(s))  Resp Panel by RT-PCR (Flu A&B, Covid) Nasopharyngeal Swab     Status: None   Collection Time: 05/02/21 10:30 AM   Specimen: Nasopharyngeal Swab; Nasopharyngeal(NP) swabs in vial transport medium  Result Value Ref Range Status   SARS Coronavirus 2 by RT PCR NEGATIVE NEGATIVE Final    Comment: (NOTE) SARS-CoV-2 target nucleic acids are NOT DETECTED.  The SARS-CoV-2 RNA is generally detectable in upper respiratory specimens during the acute phase of infection. The  lowest concentration of SARS-CoV-2 viral copies this assay can detect is 138 copies/mL. A negative result does not preclude SARS-Cov-2 infection and should not be used as the sole basis for treatment or other patient management decisions. A negative result may occur with  improper specimen collection/handling, submission of specimen other than nasopharyngeal swab, presence of viral mutation(s) within the areas targeted by this assay, and inadequate number of viral copies(<138 copies/mL). A negative result must be combined with clinical observations, patient history, and epidemiological information. The expected result is Negative.  Fact Sheet for Patients:  BloggerCourse.com  Fact Sheet for Healthcare Providers:  SeriousBroker.it  This test is no t yet approved or cleared by the [place] FDA and  has  been authorized for detection and/or diagnosis of SARS-CoV-2 by FDA under an Emergency Use Authorization (EUA). This EUA will remain  in effect (meaning this test can be used) for the duration of the COVID-19 declaration under Section 564(b)(1) of the Act, 21 U.S.C.section 360bbb-3(b)(1), unless the authorization is terminated  or revoked sooner.       Influenza A by PCR NEGATIVE NEGATIVE Final   Influenza B by PCR NEGATIVE NEGATIVE Final    Comment: (NOTE) The Xpert Xpress SARS-CoV-2/FLU/RSV plus assay is intended as an aid in the diagnosis of influenza from Nasopharyngeal swab specimens and should not be used as a sole basis for treatment. Nasal washings and aspirates are unacceptable for Xpert Xpress SARS-CoV-2/FLU/RSV testing.  Fact Sheet for Patients: BloggerCourse.comhttps://www.fda.gov/media/152166/download  Fact Sheet for Healthcare Providers: SeriousBroker.ithttps://www.fda.gov/media/152162/download  This test is not yet approved or cleared by the [place] FDA and has been authorized for detection and/or diagnosis of SARS-CoV-2 by FDA under  an Emergency Use Authorization (EUA). This EUA will remain in effect (meaning this test can be used) for the duration of the COVID-19 declaration under Section 564(b)(1) of the Act, 21 U.S.C. section 360bbb-3(b)(1), unless the authorization is terminated or revoked.  Performed at [facility], [address] [place], [place] [address]   Respiratory (~20 pathogens) panel by PCR     Status: None   Collection Time: 05/02/21 12:39 PM   Specimen: Nasopharyngeal Swab; Respiratory  Result Value Ref Range Status   Adenovirus NOT DETECTED NOT DETECTED Final   Coronavirus 229E NOT DETECTED NOT DETECTED Final    Comment: (NOTE) The Coronavirus on the Respiratory Panel, DOES NOT test for the novel  Coronavirus (2019 nCoV)    Coronavirus HKU1 NOT DETECTED NOT DETECTED Final   Coronavirus NL63 NOT DETECTED NOT DETECTED Final   Coronavirus OC43 NOT DETECTED NOT DETECTED Final   Metapneumovirus NOT DETECTED NOT DETECTED Final   Rhinovirus / Enterovirus NOT DETECTED NOT DETECTED Final   Influenza A NOT DETECTED NOT DETECTED Final   Influenza B NOT DETECTED NOT DETECTED Final   Parainfluenza Virus 1 NOT DETECTED NOT DETECTED Final   Parainfluenza Virus 2 NOT DETECTED NOT DETECTED Final   Parainfluenza Virus 3 NOT DETECTED NOT DETECTED Final   Parainfluenza Virus 4 NOT DETECTED NOT DETECTED Final   Respiratory Syncytial Virus NOT DETECTED NOT DETECTED Final   Bordetella pertussis NOT DETECTED NOT DETECTED Final   Bordetella Parapertussis NOT DETECTED NOT DETECTED Final   Chlamydophila pneumoniae NOT DETECTED NOT DETECTED Final   Mycoplasma pneumoniae NOT DETECTED NOT DETECTED Final    Comment: Performed at [facility], [address] [place], [place] [address]  MRSA Next Gen by PCR, Nasal     Status: None   Collection Time: 05/02/21 12:39 PM   Specimen: Nasopharyngeal Swab; Nasal Swab  Result Value Ref Range Status   MRSA by PCR Next Gen NOT DETECTED NOT DETECTED Final     Comment: (NOTE) The GeneXpert MRSA Assay (FDA approved for NASAL specimens only), is one component of a comprehensive MRSA colonization surveillance program. It is not intended to diagnose MRSA infection nor to guide or monitor treatment for MRSA infections. Test performance is not FDA approved in patients less than 64 years old. Performed at [facility], [address] [place], [place] [address]       Radiology Studies: DG Chest Portable 1 View  Result Date: 05/02/2021 CLINICAL DATA:  Shortness of breath and respiratory distress. Home oxygen therapy. Chest pain.  EXAM: PORTABLE CHEST 1 VIEW COMPARISON:  04/22/2021 FINDINGS: Severe emphysema. Slightly improved aeration at the site of prior bibasilar airspace opacities. These airspace opacities favor the periphery of the lung bases. Left axillary and breast clips.  Heart size within normal limits. Thoracic kyphosis. IMPRESSION: 1. Mildly improved aeration at the site of the bibasilar airspace opacities favoring mild improvement in multilobar pneumonia. 2. Severe emphysema. 3. Thoracic kyphosis. Electronically Signed   By: Gaylyn Rong M.D.   On: 05/02/2021 11:24     Scheduled Meds:  arformoterol  15 mcg Nebulization BID   ascorbic acid  250 mg Oral BID   [START ON 05/05/2021] azithromycin  250 mg Oral Daily   budesonide (PULMICORT) nebulizer solution  0.5 mg Nebulization BID   enoxaparin (LOVENOX) injection  40 mg Subcutaneous Q24H   FLUoxetine  40 mg Oral Daily   ipratropium-albuterol  3 mL Nebulization Q6H   levothyroxine  88 mcg Oral QAC breakfast   multivitamin with minerals  1 tablet Oral Daily   nicotine  14 mg Transdermal Daily   pantoprazole  40 mg Oral Daily   [START ON 05/04/2021] predniSONE  40 mg Oral Q breakfast   QUEtiapine  400 mg Oral BID   sodium chloride HYPERTONIC  4 mL Nebulization Daily   tiZANidine  4 mg Oral TID   Continuous Infusions:  azithromycin 500 mg (05/03/21 1341)     LOS: 1 day      Darlin Priestly, MD [facility] If 7PM-7AM, please contact night-coverage 05/03/2021, 6:10 PM

## 2021-05-03 NOTE — ED Notes (Signed)
Informed RN bed assigned 

## 2021-05-04 DIAGNOSIS — J9621 Acute and chronic respiratory failure with hypoxia: Secondary | ICD-10-CM | POA: Diagnosis not present

## 2021-05-04 LAB — CBC
HCT: 38.8 % (ref 36.0–46.0)
Hemoglobin: 13 g/dL (ref 12.0–15.0)
MCH: 32 pg (ref 26.0–34.0)
MCHC: 33.5 g/dL (ref 30.0–36.0)
MCV: 95.6 fL (ref 80.0–100.0)
Platelets: 229 10*3/uL (ref 150–400)
RBC: 4.06 MIL/uL (ref 3.87–5.11)
RDW: 14.5 % (ref 11.5–15.5)
WBC: 15.5 10*3/uL — ABNORMAL HIGH (ref 4.0–10.5)
nRBC: 0 % (ref 0.0–0.2)

## 2021-05-04 LAB — BASIC METABOLIC PANEL
Anion gap: 6 (ref 5–15)
BUN: 25 mg/dL — ABNORMAL HIGH (ref 8–23)
CO2: 27 mmol/L (ref 22–32)
Calcium: 9.3 mg/dL (ref 8.9–10.3)
Chloride: 107 mmol/L (ref 98–111)
Creatinine, Ser: 0.67 mg/dL (ref 0.44–1.00)
GFR, Estimated: >60 mL/min (ref >60)
Glucose, Bld: 83 mg/dL (ref 70–99)
Potassium: 4 mmol/L (ref 3.5–5.1)
Sodium: 140 mmol/L (ref 135–145)

## 2021-05-04 LAB — MAGNESIUM: Magnesium: 2.3 mg/dL (ref 1.7–2.4)

## 2021-05-04 NOTE — Progress Notes (Signed)
PROGRESS NOTE    Doris Jenkins  ZOX:[id number] DOB: Sep 19, 1955 DOA: 05/02/2021 PCP: Pcp, No  151A/151A-AA   Assessment & Plan:   Active Problems:   COPD with acute exacerbation (HCC)   Doris Ripperamela J Blevens is a 65 y.o. female with a past medical history of COPD with chronic hypoxic respiratory failure on 3 L oxygen at baseline, bipolar disorder, depression/anxiety, current smoker.   Pt was found to be in severe respiratory distress.  son added later that pt's O2 tank was empty.  When EMS arrived she was tripoding and hunched down.  She was given 2 DuoNeb treatments and 125 mg dose of Solu-Medrol prior to arrival.  Placed on CPAP in route.  In the emergency department she was placed on BiPAP.  Pt's respiratory status improved quickly and was already back down to 4L next morning.     Acute on chronic hypoxic hypercapnic respiratory failure 2/2 # running out of supplemental oxygen # Severe COPD on chronic 3L O2 at baseline --pt was found in respiratory distress, and son noted oxygen tank was empty.  Pt's respiratory status improved very quickly and was back to almost baseline the next morning after BiPAP and supplementation of oxygen.  On exam, pt was without any wheezes.  No coughing or sputum production.  Pt's respiratory distress was likely due to hypoxia from not getting her usual 3L supplemental oxygen, not from COPD exacerbation.  --Chest X-ray showed improvement in her multilobar pneumonia from previous hospitalization.  --started on azithromycin, cefepime, solumedrol and scheduled nebs.  Abx d/c'ed. --down to home 3L today. Plan: --cont prednisone 40 mg daily, taper quickly --cont nebs --Continue supplemental O2 to keep sats between 88-92%  # Confusion and audio hallucination 2/2 Hospital delirium --monitor and frequent orientation and reassurance   Mild trop elevation 2/2 demand ischemia EKG showed no acute ischemic changes.  trop 40 and 36.   Current smoker Counseled on tobacco  cessation.   --cont nicotine patch   Depression/anxiety:  --Continue home Prozac 40 mg daily  --cont home Xanax 1 mg q6h PRN (current Rx)   Hypothyroidism:  --cont home Synthroid   Bipolar disorder:  --cont home Seroquel 400 mg BID   GERD:  --cont home PPI   DVT prophylaxis: Lovenox SQ Code Status: Full code  Family Communication:  Level of care: Med-Surg Dispo:   The patient is from: home Anticipated d/c is to: SNF Anticipated d/c date is: whenever bed available Patient currently is medically ready to d/c.   Subjective and Interval History:  Pt reported her breathing was almost back to baseline.  Ate.     Objective: Vitals:   05/04/21 0841 05/04/21 0844 05/04/21 1137 05/04/21 1212  BP: 125/75   104/73  Pulse: 100   (!) 110  Resp: 18   18  Temp: 98.4 F (36.9 C)   98.7 F (37.1 C)  TempSrc:    Oral  SpO2: 99% 94% 94% 98%  Weight:      Height:        Intake/Output Summary (Last 24 hours) at 05/04/2021 1543 Last data filed at 05/04/2021 0543 Gross per 24 hour  Intake --  Output 0 ml  Net 0 ml   Filed Weights   05/02/21 1027 05/03/21 1632  Weight: 53.6 kg 53.6 kg    Examination:   Constitutional: NAD, alert and oriented to self and place HEENT: conjunctivae and lids normal, EOMI CV: No cyanosis.   RESP: normal respiratory effort, on 3L Extremities: No effusions,  edema in BLE SKIN: warm, dry Neuro: II - XII grossly intact.   Psych: Normal mood and affect.     Data Reviewed: I have personally reviewed following labs and imaging studies  CBC: Recent Labs  Lab 05/02/21 1031 05/03/21 0645 05/04/21 0513  WBC 20.9* 16.3* 15.5*  NEUTROABS 18.6* 15.6*  --   HGB 14.6 13.4 13.0  HCT 42.2 37.9 38.8  MCV 95.0 95.5 95.6  PLT 349 258 229   Basic Metabolic Panel: Recent Labs  Lab 05/02/21 1031 05/02/21 2136 05/03/21 0645 05/04/21 0513  NA 140  --  136 140  K 4.0  --  3.6 4.0  CL 102  --  101 107  CO2 28  --  24 27  GLUCOSE 107*  --  100* 83   BUN 18  --  31* 25*  CREATININE 0.94  --  0.89 0.67  CALCIUM 9.8  --  9.0 9.3  MG  --  3.0* 2.7* 2.3  PHOS  --  3.3 2.7  --    GFR: Estimated Creatinine Clearance: 60.9 mL/min (by C-G formula based on SCr of 0.67 mg/dL). Liver Function Tests: Recent Labs  Lab 05/02/21 1031  AST 25  ALT 25  ALKPHOS 79  BILITOT 1.1  PROT 7.3  ALBUMIN 4.2   No results for input(s): LIPASE, AMYLASE in the last 168 hours. No results for input(s): AMMONIA in the last 168 hours. Coagulation Profile: No results for input(s): INR, PROTIME in the last 168 hours. Cardiac Enzymes: No results for input(s): CKTOTAL, CKMB, CKMBINDEX, TROPONINI in the last 168 hours. BNP (last 3 results) No results for input(s): PROBNP in the last 8760 hours. HbA1C: No results for input(s): HGBA1C in the last 72 hours. CBG: No results for input(s): GLUCAP in the last 168 hours. Lipid Profile: No results for input(s): CHOL, HDL, LDLCALC, TRIG, CHOLHDL, LDLDIRECT in the last 72 hours. Thyroid Function Tests: No results for input(s): TSH, T4TOTAL, FREET4, T3FREE, THYROIDAB in the last 72 hours. Anemia Panel: No results for input(s): VITAMINB12, FOLATE, FERRITIN, TIBC, IRON, RETICCTPCT in the last 72 hours. Sepsis Labs: Recent Labs  Lab 05/02/21 1031  PROCALCITON <0.10    Recent Results (from the past 240 hour(s))  Resp Panel by RT-PCR (Flu A&B, Covid) Nasopharyngeal Swab     Status: None   Collection Time: 05/02/21 10:30 AM   Specimen: Nasopharyngeal Swab; Nasopharyngeal(NP) swabs in vial transport medium  Result Value Ref Range Status   SARS Coronavirus 2 by RT PCR NEGATIVE NEGATIVE Final    Comment: (NOTE) SARS-CoV-2 target nucleic acids are NOT DETECTED.  The SARS-CoV-2 RNA is generally detectable in upper respiratory specimens during the acute phase of infection. The lowest concentration of SARS-CoV-2 viral copies this assay can detect is 138 copies/mL. A negative result does not preclude  SARS-Cov-2 infection and should not be used as the sole basis for treatment or other patient management decisions. A negative result may occur with  improper specimen collection/handling, submission of specimen other than nasopharyngeal swab, presence of viral mutation(s) within the areas targeted by this assay, and inadequate number of viral copies(<138 copies/mL). A negative result must be combined with clinical observations, patient history, and epidemiological information. The expected result is Negative.  Fact Sheet for Patients:  BloggerCourse.com  Fact Sheet for Healthcare Providers:  SeriousBroker.it  This test is no t yet approved or cleared by the [place] FDA and  has been authorized for detection and/or diagnosis of SARS-CoV-2 by FDA under an Emergency  Use Authorization (EUA). This EUA will remain  in effect (meaning this test can be used) for the duration of the COVID-19 declaration under Section 564(b)(1) of the Act, 21 U.S.C.section 360bbb-3(b)(1), unless the authorization is terminated  or revoked sooner.       Influenza A by PCR NEGATIVE NEGATIVE Final   Influenza B by PCR NEGATIVE NEGATIVE Final    Comment: (NOTE) The Xpert Xpress SARS-CoV-2/FLU/RSV plus assay is intended as an aid in the diagnosis of influenza from Nasopharyngeal swab specimens and should not be used as a sole basis for treatment. Nasal washings and aspirates are unacceptable for Xpert Xpress SARS-CoV-2/FLU/RSV testing.  Fact Sheet for Patients: BloggerCourse.com  Fact Sheet for Healthcare Providers: SeriousBroker.it  This test is not yet approved or cleared by the [place] FDA and has been authorized for detection and/or diagnosis of SARS-CoV-2 by FDA under an Emergency Use Authorization (EUA). This EUA will remain in effect (meaning this test can be used) for the duration of  the COVID-19 declaration under Section 564(b)(1) of the Act, 21 U.S.C. section 360bbb-3(b)(1), unless the authorization is terminated or revoked.  Performed at [facility], [address] [place], [place] [address]   Respiratory (~20 pathogens) panel by PCR     Status: None   Collection Time: 05/02/21 12:39 PM   Specimen: Nasopharyngeal Swab; Respiratory  Result Value Ref Range Status   Adenovirus NOT DETECTED NOT DETECTED Final   Coronavirus 229E NOT DETECTED NOT DETECTED Final    Comment: (NOTE) The Coronavirus on the Respiratory Panel, DOES NOT test for the novel  Coronavirus (2019 nCoV)    Coronavirus HKU1 NOT DETECTED NOT DETECTED Final   Coronavirus NL63 NOT DETECTED NOT DETECTED Final   Coronavirus OC43 NOT DETECTED NOT DETECTED Final   Metapneumovirus NOT DETECTED NOT DETECTED Final   Rhinovirus / Enterovirus NOT DETECTED NOT DETECTED Final   Influenza A NOT DETECTED NOT DETECTED Final   Influenza B NOT DETECTED NOT DETECTED Final   Parainfluenza Virus 1 NOT DETECTED NOT DETECTED Final   Parainfluenza Virus 2 NOT DETECTED NOT DETECTED Final   Parainfluenza Virus 3 NOT DETECTED NOT DETECTED Final   Parainfluenza Virus 4 NOT DETECTED NOT DETECTED Final   Respiratory Syncytial Virus NOT DETECTED NOT DETECTED Final   Bordetella pertussis NOT DETECTED NOT DETECTED Final   Bordetella Parapertussis NOT DETECTED NOT DETECTED Final   Chlamydophila pneumoniae NOT DETECTED NOT DETECTED Final   Mycoplasma pneumoniae NOT DETECTED NOT DETECTED Final    Comment: Performed at [facility], [address] [place], [place] [address]  MRSA Next Gen by PCR, Nasal     Status: None   Collection Time: 05/02/21 12:39 PM   Specimen: Nasopharyngeal Swab; Nasal Swab  Result Value Ref Range Status   MRSA by PCR Next Gen NOT DETECTED NOT DETECTED Final    Comment: (NOTE) The GeneXpert MRSA Assay (FDA approved for NASAL specimens only), is one component of a comprehensive MRSA  colonization surveillance program. It is not intended to diagnose MRSA infection nor to guide or monitor treatment for MRSA infections. Test performance is not FDA approved in patients less than 58 years old. Performed at [facility], [address] [place], [place] [address]       Radiology Studies: No results found.   Scheduled Meds:  arformoterol  15 mcg Nebulization BID   ascorbic acid  250 mg Oral BID   budesonide (PULMICORT) nebulizer solution  0.5 mg Nebulization BID  enoxaparin (LOVENOX) injection  40 mg Subcutaneous Q24H   feeding supplement  237 mL Oral BID BM   FLUoxetine  40 mg Oral Daily   ipratropium-albuterol  3 mL Nebulization Q6H   levothyroxine  88 mcg Oral QAC breakfast   multivitamin with minerals  1 tablet Oral Daily   nicotine  14 mg Transdermal Daily   pantoprazole  40 mg Oral Daily   predniSONE  40 mg Oral Q breakfast   QUEtiapine  400 mg Oral BID   sodium chloride HYPERTONIC  4 mL Nebulization Daily   Continuous Infusions:     LOS: 2 days     Darlin Priestly, MD [facility] If 7PM-7AM, please contact night-coverage 05/04/2021, 3:43 PM

## 2021-05-04 NOTE — Evaluation (Signed)
Physical Therapy Evaluation Patient Details Name: Doris Jenkins MRN: [id number] DOB: 08-26-1956 Today's Date: 05/04/2021   History of Present Illness  Presents 2/2 acute on chronic hypoxic hypercapnic respiratory failure d/t running out of supplemental oxygen. PMHx signficant for severe COPD on 3L O2 at baseline, bipolar disorder, depression/anxiety, and is current smoker.  Clinical Impression  The pt is presenting this session greatly limited by her pulmonary status. At this time the pt presents with balance and activity tolerance deficits. She also presents with limited insight into her deficits, as she states that would be able to return home follow short gait trial even with RPE listed at 100/10. The pt was recently admitted and d/c'd home x1 week ago. At this time d/t her significant deconditioning she would best benefit from SNF placement to optimize mobility and endurance. PT will continue to follow.     Follow Up Recommendations SNF    Equipment Recommendations       Recommendations for Other Services       Precautions / Restrictions Precautions Precautions: Fall      Mobility  Bed Mobility Overal bed mobility: Modified Independent             General bed mobility comments: supine<>sit with HOB elevated, bed rails    Transfers Overall transfer level: Needs assistance Equipment used: None (furniture surfing d/t quick need to use restroom.) Transfers: Sit to/from Stand Sit to Stand: Min guard            Ambulation/Gait Ambulation/Gait assistance: Min guard Gait Distance (Feet): 10 Feet Assistive device: None (pt furniture surfing)   Gait velocity: decreased. Requires significant rest break after gait trial.      Stairs            Wheelchair Mobility    Modified Rankin (Stroke Patients Only)       Balance Overall balance assessment: Needs assistance Sitting-balance support: No upper extremity supported Sitting balance-Leahy Scale:  Normal     Standing balance support: During functional activity Standing balance-Leahy Scale: Fair                               Pertinent Vitals/Pain Pain Assessment: No/denies pain    Home Living Family/patient expects to be discharged to:: Private residence Living Arrangements: Alone Available Help at Discharge: Family;Available PRN/intermittently Type of Home: House Home Access: Stairs to enter Entrance Stairs-Rails: Right Entrance Stairs-Number of Steps: 3 Home Layout: One level Home Equipment: Walker - 2 wheels;Cane - single point Additional Comments: Pt gave permission to call son. He would like to see her mobility significantly improve prior to d/c. Agreeable to SNF    Prior Function Level of Independence: Needs assistance   Gait / Transfers Assistance Needed: Only ambulating short distances ~20' just prior to this admission.  ADL's / Homemaking Assistance Needed: Requiring assitance for ADLs and cooking  Comments: hx of falls     Hand Dominance   Dominant Hand: Right    Extremity/Trunk Assessment   Upper Extremity Assessment Upper Extremity Assessment: Generalized weakness    Lower Extremity Assessment Lower Extremity Assessment: Generalized weakness    Cervical / Trunk Assessment Cervical / Trunk Assessment: Kyphotic  Communication   Communication: No difficulties  Cognition Arousal/Alertness: Awake/alert Behavior During Therapy: WFL for tasks assessed/performed Overall Cognitive Status: Within Functional Limits for tasks assessed  General Comments      Exercises Other Exercises Other Exercises: Pt reports RPE of 100/10 after gait trial. Unable to progress mobility further this session.   Assessment/Plan    PT Assessment Patient needs continued PT services  PT Problem List Decreased strength;Decreased mobility;Decreased balance;Decreased activity tolerance;Cardiopulmonary  status limiting activity;Decreased safety awareness       PT Treatment Interventions DME instruction;Therapeutic activities;Modalities;Gait training;Therapeutic exercise;Patient/family education;Stair training;Balance training;Functional mobility training;Neuromuscular re-education;Manual techniques    PT Goals (Current goals can be found in the Care Plan section)  Acute Rehab PT Goals Patient Stated Goal: to return home PT Goal Formulation: With patient Time For Goal Achievement: 05/18/21 Potential to Achieve Goals: Fair    Frequency Min 2X/week   Barriers to discharge Decreased caregiver support;Inaccessible home environment      Co-evaluation               AM-PAC PT "6 Clicks" Mobility  Outcome Measure Help needed turning from your back to your side while in a flat bed without using bedrails?: None Help needed moving from lying on your back to sitting on the side of a flat bed without using bedrails?: None Help needed moving to and from a bed to a chair (including a wheelchair)?: A Little Help needed standing up from a chair using your arms (e.g., wheelchair or bedside chair)?: A Little Help needed to walk in Jenkins room?: A Little Help needed climbing 3-5 steps with a railing? : A Lot 6 Click Score: 19    End of Session Equipment Utilized During Treatment: Oxygen Activity Tolerance: Patient limited by fatigue Patient left: in bed;with bed alarm set Nurse Communication: Mobility status PT Visit Diagnosis: Muscle weakness (generalized) (M62.81);Unsteadiness on feet (R26.81)    Time: 8841-6606 PT Time Calculation (min) (ACUTE ONLY): 34 min   Charges:   PT Evaluation $PT Eval Moderate Complexity: 1 Mod PT Treatments $Gait Training: 8-22 mins $Therapeutic Activity: 8-22 mins        11:50 AM, 05/04/21 Doris Jenkins Doris Jenkins PT, Doris Jenkins Physical Therapist - Doris Jenkins Doris Jenkins A Amai Cappiello 05/04/2021, 11:46 AM

## 2021-05-05 DIAGNOSIS — J9621 Acute and chronic respiratory failure with hypoxia: Secondary | ICD-10-CM | POA: Diagnosis not present

## 2021-05-05 LAB — CBC
HCT: 39.3 % (ref 36.0–46.0)
Hemoglobin: 13 g/dL (ref 12.0–15.0)
MCH: 31.7 pg (ref 26.0–34.0)
MCHC: 33.1 g/dL (ref 30.0–36.0)
MCV: 95.9 fL (ref 80.0–100.0)
Platelets: 227 10*3/uL (ref 150–400)
RBC: 4.1 MIL/uL (ref 3.87–5.11)
RDW: 14.5 % (ref 11.5–15.5)
WBC: 10.4 10*3/uL (ref 4.0–10.5)
nRBC: 0 % (ref 0.0–0.2)

## 2021-05-05 LAB — BASIC METABOLIC PANEL
Anion gap: 6 (ref 5–15)
BUN: 27 mg/dL — ABNORMAL HIGH (ref 8–23)
CO2: 29 mmol/L (ref 22–32)
Calcium: 9.3 mg/dL (ref 8.9–10.3)
Chloride: 105 mmol/L (ref 98–111)
Creatinine, Ser: 0.77 mg/dL (ref 0.44–1.00)
GFR, Estimated: >60 mL/min (ref >60)
Glucose, Bld: 100 mg/dL — ABNORMAL HIGH (ref 70–99)
Potassium: 3.4 mmol/L — ABNORMAL LOW (ref 3.5–5.1)
Sodium: 140 mmol/L (ref 135–145)

## 2021-05-05 LAB — MAGNESIUM: Magnesium: 2 mg/dL (ref 1.7–2.4)

## 2021-05-05 MED ORDER — ALBUTEROL SULFATE (2.5 MG/3ML) 0.083% IN NEBU: 2.5000 mg | INHALATION_SOLUTION | RESPIRATORY_TRACT | Status: DC | PRN

## 2021-05-05 MED ORDER — BOOST / RESOURCE BREEZE PO LIQD CUSTOM
1.0000 | Freq: Three times a day (TID) | ORAL | Status: DC
  Administered 2021-05-05 – 2021-05-06 (×3): 1 via ORAL

## 2021-05-05 MED ORDER — POTASSIUM CHLORIDE CRYS ER 20 MEQ PO TBCR
40.0000 meq | EXTENDED_RELEASE_TABLET | Freq: Once | ORAL | Status: AC
  Administered 2021-05-05: 40 meq via ORAL
  Filled 2021-05-05: qty 2

## 2021-05-05 NOTE — Care Management Important Message (Signed)
Important Message  Patient Details  Name: Doris Jenkins MRN: [id number] Date of Birth: Nov 16, 1955   Medicare Important Message Given:  Yes     Johnell Comings 05/05/2021, 11:11 AM

## 2021-05-05 NOTE — Progress Notes (Signed)
PROGRESS NOTE    Doris Jenkins  FKC:[id number] DOB: [birth year] DOA: 05/02/2021 PCP: Pcp, No  151A/151A-AA   Assessment & Plan:   Active Problems:   COPD with acute exacerbation (HCC)   Doris Jenkins is a 65 y.o. female with a past medical history of COPD with chronic hypoxic respiratory failure on 3 L oxygen at baseline, bipolar disorder, depression/anxiety, current smoker.   Pt was found to be in severe respiratory distress.  son added later that pt's O2 tank was empty.  When EMS arrived she was tripoding and hunched down.  She was given 2 DuoNeb treatments and 125 mg dose of Solu-Medrol prior to arrival.  Placed on CPAP in route.  In the emergency department she was placed on BiPAP.  Pt's respiratory status improved quickly and was already back down to 4L next morning.     Acute on chronic hypoxic hypercapnic respiratory failure 2/2 # running out of supplemental oxygen # Severe COPD on chronic 3L O2 at baseline --pt was found in respiratory distress, and son noted oxygen tank was empty.  Pt's respiratory status improved very quickly and was back to almost baseline the next morning after BiPAP and supplementation of oxygen.  On exam, pt was without any wheezes.  No coughing or sputum production.  Pt's respiratory distress was likely due to hypoxia from not getting her usual 3L supplemental oxygen, not from COPD exacerbation.  --Chest X-ray showed improvement in her multilobar pneumonia from previous hospitalization.  --started on azithromycin, cefepime, solumedrol and scheduled nebs.  Abx d/c'ed. --down to home 3L. Plan: --cont prednisone 40 mg daily, taper to 20 mg daily tomorrow --cont nebs --Continue supplemental O2 to keep sats between 88-92% --need to ensure pt has oxygen at home.  # Confusion and audio hallucination 2/2 Hospital delirium --monitor and frequent orientation and reassurance   Mild trop elevation 2/2 demand ischemia EKG showed no acute ischemic changes.  trop  40 and 36.   Current smoker Counseled on tobacco cessation.   --cont nicotine patch   Depression/anxiety:  --Continue home Prozac 40 mg daily  --cont home Xanax 1 mg q6h PRN (current Rx)   Hypothyroidism:  --cont home Synthroid   Bipolar disorder:  --cont home Seroquel 400 mg BID   GERD:  --cont home PPI   DVT prophylaxis: Lovenox SQ Code Status: Full code  Family Communication:  Level of care: Med-Surg Dispo:   The patient is from: home Anticipated d/c is to: home with [facility] Anticipated d/c date is: tomorrow Patient currently is medically ready to d/c.   Subjective and Interval History:  Pt responded appropriately, but seemed confused.  Pt said that she didn't use supplemental oxygen at home.    PT found her mobility improved today, now rec home with [facility].   Objective: Vitals:   05/05/21 1431 05/05/21 1454 05/05/21 1558 05/05/21 1749  BP:  112/83 117/90 107/85  Pulse: (!) 117 (!) 118 (!) 119 (!) 106  Resp: (!) 21 20 18 15   Temp:  98.3 F (36.8 C) 98.2 F (36.8 C) 97.7 F (36.5 C)  TempSrc:  Oral Oral   SpO2: 96% 97% 98% 98%  Weight:      Height:        Intake/Output Summary (Last 24 hours) at 05/05/2021 1812 Last data filed at 05/05/2021 1426 Gross per 24 hour  Intake 360 ml  Output --  Net 360 ml   Filed Weights   05/02/21 1027 05/03/21 1632 05/05/21 0900  Weight: 53.6  kg 53.6 kg 47.6 kg    Examination:   Constitutional: NAD, alert, oriented to self and place HEENT: conjunctivae and lids normal, EOMI CV: No cyanosis.   RESP: normal respiratory effort, on 3L Extremities: No effusions, edema in BLE SKIN: warm, dry Neuro: II - XII grossly intact.   Psych: Normal mood and affect.     Data Reviewed: I have personally reviewed following labs and imaging studies  CBC: Recent Labs  Lab 05/02/21 1031 05/03/21 0645 05/04/21 0513 05/05/21 0639  WBC 20.9* 16.3* 15.5* 10.4  NEUTROABS 18.6* 15.6*  --   --   HGB 14.6 13.4 13.0 13.0  HCT 42.2 37.9  38.8 39.3  MCV 95.0 95.5 95.6 95.9  PLT 349 258 229 227   Basic Metabolic Panel: Recent Labs  Lab 05/02/21 1031 05/02/21 2136 05/03/21 0645 05/04/21 0513 05/05/21 0639  NA 140  --  136 140 140  K 4.0  --  3.6 4.0 3.4*  CL 102  --  101 107 105  CO2 28  --  24 27 29   GLUCOSE 107*  --  100* 83 100*  BUN 18  --  31* 25* 27*  CREATININE 0.94  --  0.89 0.67 0.77  CALCIUM 9.8  --  9.0 9.3 9.3  MG  --  3.0* 2.7* 2.3 2.0  PHOS  --  3.3 2.7  --   --    GFR: Estimated Creatinine Clearance: 54.1 mL/min (by C-G formula based on SCr of 0.77 mg/dL). Liver Function Tests: Recent Labs  Lab 05/02/21 1031  AST 25  ALT 25  ALKPHOS 79  BILITOT 1.1  PROT 7.3  ALBUMIN 4.2   No results for input(s): LIPASE, AMYLASE in the last 168 hours. No results for input(s): AMMONIA in the last 168 hours. Coagulation Profile: No results for input(s): INR, PROTIME in the last 168 hours. Cardiac Enzymes: No results for input(s): CKTOTAL, CKMB, CKMBINDEX, TROPONINI in the last 168 hours. BNP (last 3 results) No results for input(s): PROBNP in the last 8760 hours. HbA1C: No results for input(s): HGBA1C in the last 72 hours. CBG: No results for input(s): GLUCAP in the last 168 hours. Lipid Profile: No results for input(s): CHOL, HDL, LDLCALC, TRIG, CHOLHDL, LDLDIRECT in the last 72 hours. Thyroid Function Tests: No results for input(s): TSH, T4TOTAL, FREET4, T3FREE, THYROIDAB in the last 72 hours. Anemia Panel: No results for input(s): VITAMINB12, FOLATE, FERRITIN, TIBC, IRON, RETICCTPCT in the last 72 hours. Sepsis Labs: Recent Labs  Lab 05/02/21 1031  PROCALCITON <0.10    Recent Results (from the past 240 hour(s))  Resp Panel by RT-PCR (Flu A&B, Covid) Nasopharyngeal Swab     Status: None   Collection Time: 05/02/21 10:30 AM   Specimen: Nasopharyngeal Swab; Nasopharyngeal(NP) swabs in vial transport medium  Result Value Ref Range Status   SARS Coronavirus 2 by RT PCR NEGATIVE NEGATIVE Final     Comment: (NOTE) SARS-CoV-2 target nucleic acids are NOT DETECTED.  The SARS-CoV-2 RNA is generally detectable in upper respiratory specimens during the acute phase of infection. The lowest concentration of SARS-CoV-2 viral copies this assay can detect is 138 copies/mL. A negative result does not preclude SARS-Cov-2 infection and should not be used as the sole basis for treatment or other patient management decisions. A negative result may occur with  improper specimen collection/handling, submission of specimen other than nasopharyngeal swab, presence of viral mutation(s) within the areas targeted by this assay, and inadequate number of viral copies(<138 copies/mL). A negative  result must be combined with clinical observations, patient history, and epidemiological information. The expected result is Negative.  Fact Sheet for Patients:  BloggerCourse.comhttps://www.fda.gov/media/152166/download  Fact Sheet for Healthcare Providers:  SeriousBroker.ithttps://www.fda.gov/media/152162/download  This test is no t yet approved or cleared by the [place] FDA and  has been authorized for detection and/or diagnosis of SARS-CoV-2 by FDA under an Emergency Use Authorization (EUA). This EUA will remain  in effect (meaning this test can be used) for the duration of the COVID-19 declaration under Section 564(b)(1) of the Act, 21 U.S.C.section 360bbb-3(b)(1), unless the authorization is terminated  or revoked sooner.       Influenza A by PCR NEGATIVE NEGATIVE Final   Influenza B by PCR NEGATIVE NEGATIVE Final    Comment: (NOTE) The Xpert Xpress SARS-CoV-2/FLU/RSV plus assay is intended as an aid in the diagnosis of influenza from Nasopharyngeal swab specimens and should not be used as a sole basis for treatment. Nasal washings and aspirates are unacceptable for Xpert Xpress SARS-CoV-2/FLU/RSV testing.  Fact Sheet for Patients: BloggerCourse.comhttps://www.fda.gov/media/152166/download  Fact Sheet for Healthcare  Providers: SeriousBroker.ithttps://www.fda.gov/media/152162/download  This test is not yet approved or cleared by the [place] FDA and has been authorized for detection and/or diagnosis of SARS-CoV-2 by FDA under an Emergency Use Authorization (EUA). This EUA will remain in effect (meaning this test can be used) for the duration of the COVID-19 declaration under Section 564(b)(1) of the Act, 21 U.S.C. section 360bbb-3(b)(1), unless the authorization is terminated or revoked.  Performed at [facility], [address] [place], [place] [address]   Respiratory (~20 pathogens) panel by PCR     Status: None   Collection Time: 05/02/21 12:39 PM   Specimen: Nasopharyngeal Swab; Respiratory  Result Value Ref Range Status   Adenovirus NOT DETECTED NOT DETECTED Final   Coronavirus 229E NOT DETECTED NOT DETECTED Final    Comment: (NOTE) The Coronavirus on the Respiratory Panel, DOES NOT test for the novel  Coronavirus (2019 nCoV)    Coronavirus HKU1 NOT DETECTED NOT DETECTED Final   Coronavirus NL63 NOT DETECTED NOT DETECTED Final   Coronavirus OC43 NOT DETECTED NOT DETECTED Final   Metapneumovirus NOT DETECTED NOT DETECTED Final   Rhinovirus / Enterovirus NOT DETECTED NOT DETECTED Final   Influenza A NOT DETECTED NOT DETECTED Final   Influenza B NOT DETECTED NOT DETECTED Final   Parainfluenza Virus 1 NOT DETECTED NOT DETECTED Final   Parainfluenza Virus 2 NOT DETECTED NOT DETECTED Final   Parainfluenza Virus 3 NOT DETECTED NOT DETECTED Final   Parainfluenza Virus 4 NOT DETECTED NOT DETECTED Final   Respiratory Syncytial Virus NOT DETECTED NOT DETECTED Final   Bordetella pertussis NOT DETECTED NOT DETECTED Final   Bordetella Parapertussis NOT DETECTED NOT DETECTED Final   Chlamydophila pneumoniae NOT DETECTED NOT DETECTED Final   Mycoplasma pneumoniae NOT DETECTED NOT DETECTED Final    Comment: Performed at [facility], [address] [place], [place] [address]  MRSA Next Gen by  PCR, Nasal     Status: None   Collection Time: 05/02/21 12:39 PM   Specimen: Nasopharyngeal Swab; Nasal Swab  Result Value Ref Range Status   MRSA by PCR Next Gen NOT DETECTED NOT DETECTED Final    Comment: (NOTE) The GeneXpert MRSA Assay (FDA approved for NASAL specimens only), is one component of a comprehensive MRSA colonization surveillance program. It is not intended to diagnose MRSA infection nor to guide or monitor treatment for MRSA infections. Test performance is not FDA approved  in patients less than 38 years old. Performed at [facility], [address], [place], [place] [address]       Radiology Studies: No results found.   Scheduled Meds:  arformoterol  15 mcg Nebulization BID   ascorbic acid  250 mg Oral BID   budesonide (PULMICORT) nebulizer solution  0.5 mg Nebulization BID   enoxaparin (LOVENOX) injection  40 mg Subcutaneous Q24H   feeding supplement  1 Container Oral TID BM   FLUoxetine  40 mg Oral Daily   ipratropium-albuterol  3 mL Nebulization Q6H   levothyroxine  88 mcg Oral QAC breakfast   multivitamin with minerals  1 tablet Oral Daily   nicotine  14 mg Transdermal Daily   pantoprazole  40 mg Oral Daily   predniSONE  40 mg Oral Q breakfast   QUEtiapine  400 mg Oral BID   Continuous Infusions:     LOS: 3 days     Darlin Priestly, MD [facility] If 7PM-7AM, please contact night-coverage 05/05/2021, 6:12 PM

## 2021-05-05 NOTE — [place] FL2 (Signed)
[place] MEDICAID FL2 LEVEL OF CARE SCREENING TOOL     IDENTIFICATION  Patient Name: Doris Jenkins Birthdate: 10-26-1955 Sex: female Admission Date (Current Location): 05/02/2021  [facility] and [place] Number:  Chiropodist and Address:  [facility], [address] [place], [place] [address]      Provider Number: [id number]  Attending Physician Name and Address:  Darlin Priestly, MD  Relative Name and Phone Number:  Jonny Ruiz, SOn [phone]    Current Level of Care: Hospital Recommended Level of Care: Skilled Nursing Facility Prior Approval Number:    Date Approved/Denied:   PASRR Number: [id number]  Discharge Plan: SNF    Current Diagnoses: Patient Active Problem List   Diagnosis Date Noted   COPD with acute exacerbation (HCC) 05/02/2021   Palliative care by specialist    SOB (shortness of breath)    Protein-calorie malnutrition, severe 04/23/2021   CAP (community acquired pneumonia) 04/22/2021   Sepsis (HCC) 04/22/2021   Acute hypoxemic respiratory failure (HCC) 02/14/2021   Acute respiratory failure with hypoxia (HCC) 02/13/2021   COPD exacerbation (HCC) 02/13/2021   Hypokalemia 02/13/2021   Encephalopathy 06/06/2016   Acute delirium 06/06/2016   Bipolar disorder (HCC) 06/06/2016   Neuropathic pain 05/09/2015   Essential hypertension 06/05/2014   Back pain, chronic 07/27/2013   COPD (chronic obstructive pulmonary disease) (HCC) 07/27/2013   Hypothyroidism 07/27/2013    Orientation RESPIRATION BLADDER Height & Weight     Self, Time, Situation, Place  O2 (3 liters) External catheter, Continent Weight: 47.6 kg (bed weight) Height:  5' 4.02" (162.6 cm)  BEHAVIORAL SYMPTOMS/MOOD NEUROLOGICAL BOWEL NUTRITION STATUS      Continent Diet (regular)  AMBULATORY STATUS COMMUNICATION OF NEEDS Skin   Extensive Assist Verbally Normal                       Personal Care Assistance Level of Assistance  Dressing, Bathing Bathing  Assistance: Limited assistance   Dressing Assistance: Limited assistance     Functional Limitations Info             SPECIAL CARE FACTORS FREQUENCY  PT (By licensed PT), OT (By licensed OT)     PT Frequency: 5 times per week OT Frequency: 5 times per week            Contractures Contractures Info: Not present    Additional Factors Info  Code Status, Allergies Code Status Info: full code Allergies Info: Bee Venom, Sulfa Antibiotics, Poison Oak Extract, Sulfasalazine           Current Medications (05/05/2021):  This is the current hospital active medication list Current Facility-Administered Medications  Medication Dose Route Frequency Provider Last Rate Last Admin   albuterol (PROVENTIL) (2.5 MG/3ML) 0.083% nebulizer solution 2.5 mg  2.5 mg Nebulization Q4H PRN Darlin Priestly, MD       ALPRAZolam Prudy Feeler) tablet 1 mg  1 mg Oral Q6H PRN Darlin Priestly, MD   1 mg at 05/04/21 2110   arformoterol (BROVANA) nebulizer solution 15 mcg  15 mcg Nebulization BID Marcelo Baldy, MD   15 mcg at 05/05/21 0100   ascorbic acid (VITAMIN C) tablet 250 mg  250 mg Oral BID Lynwood Dawley S, DO   250 mg at 05/05/21 0955   budesonide (PULMICORT) nebulizer solution 0.5 mg  0.5 mg Nebulization BID Marcelo Baldy, MD   0.5 mg at 05/05/21 0751   enoxaparin (LOVENOX) injection 40 mg  40 mg Subcutaneous Q24H Anwar,  Shayan S, DO   40 mg at 05/04/21 2111   feeding supplement (BOOST / RESOURCE BREEZE) liquid 1 Container  1 Container Oral TID BM Darlin Priestly, MD   1 Container at 05/05/21 1254   FLUoxetine (PROZAC) capsule 40 mg  40 mg Oral Daily Lynwood Dawley S, DO   40 mg at 05/05/21 0954   hydrALAZINE (APRESOLINE) injection 10 mg  10 mg Intravenous Q4H PRN Lynwood Dawley S, DO   10 mg at 05/02/21 1440   ipratropium-albuterol (DUONEB) 0.5-2.5 (3) MG/3ML nebulizer solution 3 mL  3 mL Nebulization Q6H Darlin Priestly, MD   3 mL at 05/05/21 1429   levothyroxine (SYNTHROID) tablet 88 mcg  88 mcg Oral QAC breakfast  Verdia Kuba, DO   88 mcg at 05/05/21 0350   multivitamin with minerals tablet 1 tablet  1 tablet Oral Daily Lynwood Dawley S, DO   1 tablet at 05/05/21 0954   nicotine (NICODERM CQ - dosed in mg/24 hours) patch 14 mg  14 mg Transdermal Daily Lynwood Dawley S, DO   14 mg at 05/05/21 0954   pantoprazole (PROTONIX) EC tablet 40 mg  40 mg Oral Daily Lynwood Dawley S, DO   40 mg at 05/05/21 0938   predniSONE (DELTASONE) tablet 40 mg  40 mg Oral Q breakfast Marcelo Baldy, MD   40 mg at 05/05/21 0746   QUEtiapine (SEROQUEL) tablet 400 mg  400 mg Oral BID Lynwood Dawley S, DO   400 mg at 05/05/21 1829   tiZANidine (ZANAFLEX) tablet 4 mg  4 mg Oral TID PRN Darlin Priestly, MD         Discharge Medications: Please see discharge summary for a list of discharge medications.  Relevant Imaging Results:  Relevant Lab Results:   Additional Information SS# [id number]  Barrie Dunker, RN

## 2021-05-05 NOTE — Progress Notes (Signed)
Initial Nutrition Assessment  DOCUMENTATION CODES:  Underweight, Severe malnutrition in context of chronic illness  INTERVENTION:  Continue regular diet as ordered Boost Breeze po TID, each supplement provides 250 kcal and 9 grams of protein Vital Cuisine BID, each supplement provides 520kcal and 22g of protein.  Magic cup TID with meals, each supplement provides 290 kcal and 9 grams of protein Continue MVI and vitamin C as ordered  NUTRITION DIAGNOSIS:  Severe Malnutrition (in the context of chronic illness) related to poor appetite as evidenced by severe fat depletion, severe muscle depletion.  GOAL:  Patient will meet greater than or equal to 90% of their needs  MONITOR:  PO intake, Supplement acceptance, Weight trends  REASON FOR ASSESSMENT:  Consult Assessment of nutrition requirement/status  ASSESSMENT:  65 y.o. female with past medical history of HTN, COPD on 3L, GERD, breast cancer s/p partial mastectomy and XRT, and bipolar disorder brought to ED by EMS with SOB.  Patient currently lives by herself and was found by one of her neighbors earlier today having significant difficulty breathing.  Pt recently admitted 8/9-8/13 and followed by RD at that time. Pt dx with severe malnutrition during last admission. Pt reported during last admission she does not like Ensure or Boost supplements but was willing to try Boost Breeze, Vital Cuisine, and orange Magic Cups.  Pt resting in bed at the time of visit. States that she can't remember much about her intake in the brief period when she was home, but that she did eat well this AM. Reiterates previously reported dislike of ensure. Open to same supplement regimen she was on last time. Will discharge to facility when bed is secured.  Average Meal Intake: 8/19-8/22: 50% intake x 1 recorded meal  Nutritionally Relevant Medications: Scheduled Meds:  ascorbic acid  250 mg Oral BID   feeding supplement  237 mL Oral BID BM    levothyroxine  88 mcg Oral QAC breakfast   multivitamin with minerals  1 tablet Oral Daily   pantoprazole  40 mg Oral Daily   predniSONE  40 mg Oral Q breakfast   Labs Reviewed  NUTRITION - FOCUSED PHYSICAL EXAM: Flowsheet Row Most Recent Value  Orbital Region Moderate depletion  Upper Arm Region Severe depletion  Thoracic and Lumbar Region Severe depletion  Buccal Region Moderate depletion  Temple Region Mild depletion  Clavicle Bone Region Moderate depletion  Clavicle and Acromion Bone Region Severe depletion  Scapular Bone Region Severe depletion  Dorsal Hand Severe depletion  Patellar Region Severe depletion  Anterior Thigh Region Severe depletion  Posterior Calf Region Severe depletion  Edema (RD Assessment) None  Hair Reviewed  Eyes Reviewed  Mouth Reviewed  Skin Reviewed  Nails Reviewed   Diet Order:   Diet Order             Diet regular Room service appropriate? Yes; Fluid consistency: Thin  Diet effective now                   EDUCATION NEEDS:  No education needs have been identified at this time  Skin:  Skin Assessment: Reviewed RN Assessment  Last BM:  8/20 - type 5  Height:  Ht Readings from Last 1 Encounters:  05/03/21 5' 4.02" (1.626 m)    Weight:  Wt Readings from Last 1 Encounters:  05/05/21 47.6 kg    Ideal Body Weight:  54.5 kg  BMI:  Body mass index is 18 kg/m.  Estimated Nutritional Needs:  Kcal:  1500-1700  Protein:  75-90 g/d Fluid:  1.5-1.8 L/d  Greig Castilla, RD, LDN Clinical Dietitian Pager on Amion

## 2021-05-05 NOTE — Progress Notes (Signed)
Physical Therapy Treatment Patient Details Name: Doris Jenkins MRN: [id number] DOB: May 18, 1956 Today's Date: 05/05/2021    History of Present Illness Presents 2/2 acute on chronic hypoxic hypercapnic respiratory failure d/t running out of supplemental oxygen. PMHx signficant for severe COPD on 3L O2 at baseline, bipolar disorder, depression/anxiety, and is current smoker.    PT Comments    Pt is making excellent progress towards goals with improved gait distance this session. Educated on benefit of RW for energy conservation. All mobility performed on 3L of O2 (baseline). O2 sats ranged 91-97%. 1 seated break required during ambulation. Upgrading to HHPT, discussed with care team. Will continue to progress as able.   Follow Up Recommendations  Home health PT     Equipment Recommendations  Rolling walker with 5" wheels    Recommendations for Other Services       Precautions / Restrictions Precautions Precautions: Fall Restrictions Weight Bearing Restrictions: No    Mobility  Bed Mobility Overal bed mobility: Modified Independent             General bed mobility comments: safe technique with ease of mobility. Once seated, upright posture noted    Transfers Overall transfer level: Needs assistance Equipment used: Rolling walker (2 wheeled) Transfers: Sit to/from Stand Sit to Stand: Supervision         General transfer comment: safe technique with upright posture  Ambulation/Gait Ambulation/Gait assistance: Supervision Gait Distance (Feet): 100 Feet Assistive device: Rolling walker (2 wheeled) Gait Pattern/deviations: Step-through pattern     General Gait Details: ambulated in room with seated rest break after 50'. Rw used safely with pt able to complete turns without LOB. All mobility performed on 3L of O2 with sats decreasing to 91% with exertion. HR increases to 127bpm with mobility.   Stairs             Wheelchair Mobility    Modified Rankin  (Stroke Patients Only)       Balance Overall balance assessment: Needs assistance Sitting-balance support: No upper extremity supported Sitting balance-Leahy Scale: Normal     Standing balance support: During functional activity Standing balance-Leahy Scale: Fair                              Cognition Arousal/Alertness: Awake/alert Behavior During Therapy: WFL for tasks assessed/performed Overall Cognitive Status: Within Functional Limits for tasks assessed                                        Exercises Other Exercises Other Exercises: seated ther-ex performed on B LE including alt. marching, LAQ, B UE bicep curls, and shoulder flexion. Also performed IS to x 10 reps. Supervision given for all ther-ex and O2 sats monitored throughout.    General Comments        Pertinent Vitals/Pain Pain Assessment: No/denies pain    Home Living                      Prior Function            PT Goals (current goals can now be found in the care plan section) Acute Rehab PT Goals Patient Stated Goal: to return home PT Goal Formulation: With patient Time For Goal Achievement: 05/18/21 Potential to Achieve Goals: Fair Progress towards PT goals: Progressing toward goals    Frequency  Min 2X/week      PT Plan Discharge plan needs to be updated    Co-evaluation              AM-PAC PT "6 Clicks" Mobility   Outcome Measure  Help needed turning from your back to your side while in a flat bed without using bedrails?: None Help needed moving from lying on your back to sitting on the side of a flat bed without using bedrails?: None Help needed moving to and from a bed to a chair (including a wheelchair)?: A Little Help needed standing up from a chair using your arms (e.g., wheelchair or bedside chair)?: A Little Help needed to walk in hospital room?: A Little Help needed climbing 3-5 steps with a railing? : A Little 6 Click  Score: 20    End of Session Equipment Utilized During Treatment: Gait belt;Oxygen Activity Tolerance: Patient limited by fatigue Patient left: in bed;with bed alarm set Nurse Communication: Mobility status PT Visit Diagnosis: Muscle weakness (generalized) (M62.81);Unsteadiness on feet (R26.81)     Time: 1528-1600 PT Time Calculation (min) (ACUTE ONLY): 32 min  Charges:  $Gait Training: 8-22 mins $Therapeutic Exercise: 8-22 mins                     Doris Jenkins, PT, DPT [phone]    Doris Jenkins 05/05/2021, 4:35 PM

## 2021-05-05 NOTE — TOC Progression Note (Signed)
Transition of Care [facility]) - Progression Note    Patient Details  Name: Doris Jenkins MRN: [id number] Date of Birth: [birth year]  Transition of Care [facility]) CM/SW [place], RN Phone Number: 05/05/2021, 3:27 PM  Clinical Narrative:     Met with the patient in the room to discuss DC plan and needs She is agreeable to go to [facility] SNF, She has not had any Covid vaccines, Bed search sent, FL2 completed, PASSR obtained       Expected Discharge Plan and Services                                                 Social Determinants of Health (SDOH) Interventions    Readmission Risk Interventions No flowsheet data found.

## 2021-05-05 NOTE — TOC Progression Note (Addendum)
Transition of Care [facility]) - Progression Note    Patient Details  Name: Doris Jenkins MRN: [id number] Date of Birth: [birth year]  Transition of Care [facility]) CM/SW Contact  Barrie Dunker, RN Phone Number: 05/05/2021, 4:27 PM  Clinical Narrative:     Patient did much better With PT today and has been changed to [facility] as recommendation, [facility] has accepted the patient for [facility] services, Spoke to [place], the patient received a rollator on 04/25/21 and will not be able to get a rolling walker unless private pay She stated that she has transportation, she is not sure if she has Oxygen at home or not, I left a VM for her son Cletis [place] for a call back Will continue to Monitor for Needs Her son called and said she does have oxygen at home and does have a concentrator       Expected Discharge Plan and Services                                                 Social Determinants of Health (SDOH) Interventions    Readmission Risk Interventions No flowsheet data found.

## 2021-05-06 DIAGNOSIS — J9621 Acute and chronic respiratory failure with hypoxia: Secondary | ICD-10-CM | POA: Diagnosis not present

## 2021-05-06 DIAGNOSIS — Z7189 Other specified counseling: Secondary | ICD-10-CM

## 2021-05-06 LAB — BASIC METABOLIC PANEL
Anion gap: 9 (ref 5–15)
BUN: 22 mg/dL (ref 8–23)
CO2: 26 mmol/L (ref 22–32)
Calcium: 9.2 mg/dL (ref 8.9–10.3)
Chloride: 105 mmol/L (ref 98–111)
Creatinine, Ser: 0.76 mg/dL (ref 0.44–1.00)
GFR, Estimated: >60 mL/min (ref >60)
Glucose, Bld: 98 mg/dL (ref 70–99)
Potassium: 3.7 mmol/L (ref 3.5–5.1)
Sodium: 140 mmol/L (ref 135–145)

## 2021-05-06 LAB — CBC
HCT: 42.9 % (ref 36.0–46.0)
Hemoglobin: 14.3 g/dL (ref 12.0–15.0)
MCH: 32.5 pg (ref 26.0–34.0)
MCHC: 33.3 g/dL (ref 30.0–36.0)
MCV: 97.5 fL (ref 80.0–100.0)
Platelets: 229 10*3/uL (ref 150–400)
RBC: 4.4 MIL/uL (ref 3.87–5.11)
RDW: 14.3 % (ref 11.5–15.5)
WBC: 11.1 10*3/uL — ABNORMAL HIGH (ref 4.0–10.5)
nRBC: 0 % (ref 0.0–0.2)

## 2021-05-06 LAB — MAGNESIUM: Magnesium: 1.8 mg/dL (ref 1.7–2.4)

## 2021-05-06 MED ORDER — TIZANIDINE HCL 4 MG PO TABS: 4.0000 mg | ORAL_TABLET | Freq: Three times a day (TID) | ORAL | 0 refills | Status: DC | PRN

## 2021-05-06 MED ORDER — PREDNISONE 20 MG PO TABS
20.0000 mg | ORAL_TABLET | Freq: Every day | ORAL | Status: DC
  Administered 2021-05-06: 20 mg via ORAL
  Filled 2021-05-06: qty 1

## 2021-05-06 MED ORDER — ALPRAZOLAM 1 MG PO TABS
1.0000 mg | ORAL_TABLET | Freq: Four times a day (QID) | ORAL | 0 refills | Status: DC | PRN
Start: 2021-05-06 — End: 2023-08-09

## 2021-05-06 MED ORDER — ACETAMINOPHEN 325 MG PO TABS
650.0000 mg | ORAL_TABLET | Freq: Four times a day (QID) | ORAL | Status: DC | PRN
  Administered 2021-05-06: 650 mg via ORAL
  Filled 2021-05-06: qty 2

## 2021-05-06 NOTE — TOC Progression Note (Signed)
Transition of Care [facility]) - Progression Note    Patient Details  Name: Doris Jenkins MRN: [id number] Date of Birth: August 05, 1956  Transition of Care [facility]) CM/SW Contact  Barrie Dunker, RN Phone Number: 05/06/2021, 8:31 AM  Clinical Narrative:    Had detailed conversation with the patient's son Doris Jenkins.  Discussed what insurance does and does not cover.  Explained that Home health is only 2-3 times per week and only approx an hour each time, Explained a person that stays hours multiple days os not covered under her insurance.  Explained that it is either private pay of covered under medicaid.  He confirmed that she does have a rolator at home as well as Oxygen,  the oxygen was received in June and she does have a concentrator provided by [employer].  He provides transportation.  I explained that they need to start planning for when she is not able to care for her self such as having family and friends help out or to stay with her.  He stated understanding, I explained the plan would be to DC home with [facility] for [facility] services on Tuesday (today). He stated understanding        Expected Discharge Plan and Services                                                 Social Determinants of Health (SDOH) Interventions    Readmission Risk Interventions No flowsheet data found.

## 2021-05-06 NOTE — Consult Note (Signed)
Consultation Note Date: 05/06/2021   Patient Name: Doris Jenkins  DOB: 10-23-1955  MRN: [id number]  Age / Sex: 65 y.o., female  PCP: Pcp, No Referring Physician: Darlin Priestly, MD  Reason for Consultation: Establishing goals of care  HPI/Patient Profile: 65 y.o. female  admitted on 05/02/2021 with a past medical history of COPD with chronic hypoxic respiratory failure on 3 L oxygen at baseline, bipolar disorder, depression/anxiety, tobacco dependence, GERD.  Patient lives alone.  When patient did not answer her phone family got in touch with her neighbors and when they checked on her she was found to be in severe respiratory distress.  When EMS arrived she was tripoding and hunched down.  She was given 2 DuoNeb treatments and 125 mg dose of Solu-Medrol prior to arrival.  Placed on CPAP in route.  In the emergency department she was placed on BiPAP and tachypnea improved somewhat from the 40s to now low 30s.  Still not moving much air.  Using accessory muscles.  Recently hospitalized for pneumonia.    Patient has had multiple rehospitalizations in the last 6 months.  Patient  is intermittently confused.  Patient and family face treatment option decisions, advanced directive decisions and anticipatory care needs.   Clinical Assessment and Goals of Care:  This NP Lorinda Creed reviewed medical records, received report from team, assessed the patient and then meet at the patient's bedside and spoke to her son Felisha Claytor by telephone to discuss diagnosis, prognosis, GOC, EOL wishes disposition and options.   Concept of Palliative Care was introduced as specialized medical care for people and their families living with serious illness.  If focuses on providing relief from the symptoms and stress of a serious illness.  The goal is to improve quality of life for both the patient and the family. Values and goals of care  important to patient and family were attempted to be elicited.  Created space and opportunity for patient  and family to explore thoughts and feelings regarding current medical situation.  Patient has limited insight into the seriousness of her current medical situation and the dense care needs that she requires to remain at home safely. Her son Jonny Ruiz understands the situation but is frustrated with the limited options available to his mother for personal care services/nursing care in her home.  Jonny Ruiz is an only child and patient has no other  family or friends support.   Education offered on hospice benefit both in the home and residential.  Education offered on skilled nursing facilities for rehab versus custodial care.   A  discussion was had today regarding advanced directives.  Concepts specific to code status, artifical feeding and hydration, continued IV antibiotics and rehospitalization was had.  The difference between a aggressive medical intervention path  and a palliative comfort care path for this patient at this time was had.     MOST form introduced   Natural trajectory and expectations at EOL were discussed.  Questions and concerns addressed.  Patient  encouraged to call  with questions or concerns.     PMT will continue to support holistically.         No documented H POA or advanced care planning documents. Education offered on the importance of conversation and documentation of patient's wishes.  Offered spiritual  care support to complete these documents but declined at this time     SUMMARY OF RECOMMENDATIONS    Code Status/Advance Care Planning: Full code Educated patient/family to consider DNR/DNI status understanding evidenced based poor outcomes in similar hospitalized patient, as the cause of arrest is likely associated with advanced chronic illness rather than an easily reversible acute cardio-pulmonary event.    Additional Recommendations (Limitations, Scope,  Preferences): Full Scope Treatment  Psycho-social/Spiritual:  Desire for further Chaplaincy support:no Additional Recommendations: Education on Hospice  Prognosis:  Unable to determine  Discharge Planning: Home with Home Health      Primary Diagnoses: Present on Admission:  COPD with acute exacerbation (HCC)   I have reviewed the medical record, interviewed the patient and family, and examined the patient. The following aspects are pertinent.  Past Medical History:  Diagnosis Date   Bipolar disorder (HCC)    Hypertension    Hypothyroidism    Social History   Socioeconomic History   Marital status: Single    Spouse name: Not on file   Number of children: Not on file   Years of education: Not on file   Highest education level: Not on file  Occupational History   Not on file  Tobacco Use   Smoking status: Every Day    Types: Cigarettes   Smokeless tobacco: Never  Substance and Sexual Activity   Alcohol use: No   Drug use: No   Sexual activity: Yes    Birth control/protection: None  Other Topics Concern   Not on file  Social History Narrative   Not on file   Social Determinants of Health   Financial Resource Strain: Not on file  Food Insecurity: Not on file  Transportation Needs: Not on file  Physical Activity: Not on file  Stress: Not on file  Social Connections: Not on file   Family History  Problem Relation Age of Onset   Cancer Mother    Heart attack Father    Scheduled Meds:  arformoterol  15 mcg Nebulization BID   ascorbic acid  250 mg Oral BID   budesonide (PULMICORT) nebulizer solution  0.5 mg Nebulization BID   enoxaparin (LOVENOX) injection  40 mg Subcutaneous Q24H   feeding supplement  1 Container Oral TID BM   FLUoxetine  40 mg Oral Daily   ipratropium-albuterol  3 mL Nebulization Q6H   levothyroxine  88 mcg Oral QAC breakfast   multivitamin with minerals  1 tablet Oral Daily   nicotine  14 mg Transdermal Daily   pantoprazole  40 mg  Oral Daily   predniSONE  20 mg Oral Q breakfast   QUEtiapine  400 mg Oral BID   Continuous Infusions: PRN Meds:.albuterol, ALPRAZolam, hydrALAZINE, tiZANidine Medications Prior to Admission:  Prior to Admission medications   Medication Sig Start Date End Date Taking? Authorizing Provider  albuterol (ACCUNEB) 1.25 MG/3ML nebulizer solution Take 1.25 mg by nebulization 3 (three) times daily as needed for wheezing or shortness of breath.   Yes [provider]  albuterol (VENTOLIN HFA) 108 (90 Base) MCG/ACT inhaler Inhale 2 puffs into the lungs every 4 (four) hours as needed. 01/25/21  Yes [provider]  ALPRAZolam Prudy Feeler(XANAX) 1 MG tablet Take 1  mg by mouth 4 (four) times daily. 03/31/21  Yes [provider]  ascorbic acid (VITAMIN C) 250 MG tablet Take 1 tablet (250 mg total) by mouth 2 (two) times daily. 04/26/21  Yes Arnetha Courser, MD  BREO ELLIPTA 100-25 MCG/INH AEPB Inhale 1 puff into the lungs daily. 12/25/20  Yes [provider]  EPINEPHrine 0.3 mg/0.3 mL IJ SOAJ injection Inject 0.3 mg into the muscle as needed. 06/21/15  Yes [provider]  FLUoxetine (PROZAC) 40 MG capsule Take 40 mg by mouth daily.   Yes [provider]  levothyroxine (SYNTHROID) 88 MCG tablet Take 88 mcg by mouth daily before breakfast.   Yes [provider]  Multiple Vitamin (MULTIVITAMIN WITH MINERALS) TABS tablet Take 1 tablet by mouth daily. 04/26/21  Yes Arnetha Courser, MD  naproxen (NAPROSYN) 500 MG tablet Take 500 mg by mouth 2 (two) times daily as needed. 02/04/21  Yes [provider]  omeprazole (PRILOSEC) 40 MG capsule Take 40 mg by mouth daily. 01/02/21  Yes [provider]  QUEtiapine (SEROQUEL) 400 MG tablet Take 400 mg by mouth 2 (two) times daily. 04/20/21  Yes [provider]  tiotropium (SPIRIVA HANDIHALER) 18 MCG inhalation capsule Place 1 capsule into inhaler and inhale daily. 06/21/15  Yes [provider]   tiZANidine (ZANAFLEX) 4 MG tablet Take 4 mg by mouth 3 (three) times daily. 02/17/21  Yes [provider]  gabapentin (NEURONTIN) 400 MG capsule Take 400 mg by mouth 4 (four) times daily. Patient not taking: Reported on 05/02/2021 04/28/21   [provider]  nicotine (NICODERM CQ - DOSED IN MG/24 HOURS) 14 mg/24hr patch Place 14 mg onto the skin daily. Patient not taking: Reported on 05/02/2021 11/19/20   [provider]   Allergies  Allergen Reactions   Bee Venom Anaphylaxis   Sulfa Antibiotics Itching, Rash and Shortness Of Breath    Redness    Poison Oak Extract     Other reaction(s): Unknown   Sulfasalazine Itching   Review of Systems  All other systems reviewed and are negative.  Physical Exam Cardiovascular:     Rate and Rhythm: Normal rate.  Pulmonary:     Effort: Pulmonary effort is normal.  Skin:    General: Skin is warm and dry.  Neurological:     Mental Status: She is alert.    Vital Signs: BP 109/84 (BP Location: Right Arm)   Pulse (!) 106   Temp 98.6 F (37 C)   Resp 16   Ht 5' 4.02" (1.626 m)   Wt 47.6 kg Comment: bed weight  SpO2 99%   BMI 18.00 kg/m  Pain Scale: 0-10   Pain Score: 0-No pain   SpO2: SpO2: 99 % O2 Device:SpO2: 99 % O2 Flow Rate: .O2 Flow Rate (L/min): 3 L/min  IO: Intake/output summary:  Intake/Output Summary (Last 24 hours) at 05/06/2021 0827 Last data filed at 05/05/2021 1426 Gross per 24 hour  Intake 360 ml  Output --  Net 360 ml    LBM: Last BM Date: 05/03/21 Baseline Weight: Weight: 53.6 kg Most recent weight: Weight: 47.6 kg (bed weight)     Palliative Assessment/Data: 30% at best     Discussed with Dr. Fran Lowes  Time In: 0730 Time Out: 0830  Time Total: 60 minutes Greater than 50%  of this time was spent counseling and coordinating care related to the above assessment and plan.  Signed by: Lorinda Creed, NP   Please contact Palliative Medicine Team phone  at [phone] for questions and concerns.   For individual provider: See Loretha Stapler

## 2021-05-06 NOTE — TOC Progression Note (Signed)
Transition of Care [facility]) - Progression Note    Patient Details  Name: Doris Jenkins MRN: [id number] Date of Birth: November 20, 1955  Transition of Care [facility]) CM/SW Contact  Barrie Dunker, RN Phone Number: 05/06/2021, 12:11 PM  Clinical Narrative:     Spoke with the patient's son Doris Jenkins.  I explained to him that      She is Chronic with COPD and I explained to him that this will be an ongoing issue, I encouraged him to apply for Medicaid to be able to get long term help,   I provided him with the number to call [employer] to get the Oxygen tanks replaced when empty   Expected Discharge Plan and Services           Expected Discharge Date: 05/06/21                                     Social Determinants of Health (SDOH) Interventions    Readmission Risk Interventions No flowsheet data found.

## 2021-05-06 NOTE — Discharge Summary (Addendum)
Physician Discharge Summary   Doris Jenkins  female DOB: 05-Jul-1956  ZDG:[id number]  PCP: Pcp, No  Admit date: 05/02/2021 Discharge date: 05/06/2021  Admitted From: home Disposition:  home Home Health: Yes CODE STATUS: Full code  Discharge Instructions     Discharge instructions   Complete by: As directed    You were admitted to the hospital in June and then in Aug for COPD flare up, so as we discussed, you need to stop smoking.  This time you came to the hospital with respiratory distress because your oxygen tank ran empty.  We have arranged for oxygen company to go check your oxygen tank at home.  Please monitor the tank level to ensure it doesn't run out.  You do need to be on 3 liters of oxygen at all times.  Physically you are doing good enough to not qualify for skilled nursing rehab, but it appears that you do need some extra help at home.  We ordered you home health services, but please have your family stay with you or check in on you in the coming few weeks.   [facility] Course:  For full details, please see H&P, progress notes, consult notes and ancillary notes.  Briefly,  Doris Jenkins is a 65 y.o. female with a past medical history of COPD with chronic hypoxic respiratory failure on 3 L oxygen at baseline, bipolar disorder, depression/anxiety, current smoker.   Pt was found to be in severe respiratory distress.  son added later that pt's O2 tank was empty.  When EMS arrived she was tripoding and hunched down.  She was given 2 DuoNeb treatments and 125 mg dose of Solu-Medrol prior to arrival.  Placed on CPAP in route.  In the emergency department she was placed on BiPAP.  Pt's respiratory status improved quickly and was already back down to 4L next morning.     Acute on chronic hypoxic hypercapnic respiratory failure 2/2 # running out of supplemental oxygen # Severe COPD on chronic 3L O2 at baseline --pt was found in respiratory distress, and son noted  oxygen tank was empty.  Pt's respiratory status improved very quickly and was back to almost baseline the next morning after BiPAP and supplementation of oxygen.  On exam, pt was without any wheezes.  No coughing or sputum production.  Pt's respiratory distress was likely due to hypoxia from not getting her usual 3L supplemental oxygen, not from COPD exacerbation.  --Chest X-ray showed improvement in her multilobar pneumonia from previous hospitalization.  --started on azithromycin, cefepime, solumedrol and scheduled nebs on admission.  Abx d/c'ed. --steroid quickly tapered down to 20 mg daily and not ordered at discharge. --Pt discharge on her baseline 3L O2 and home inhalers (see below). --O2 company asked to ensure pt has enough O2 at home.   # Confusion and audio hallucination 2/2 Hospital delirium --monitored and frequent orientation and reassurance    Mild trop elevation 2/2 demand ischemia EKG showed no acute ischemic changes.  trop 40 and 36.   Current smoker Counseled on tobacco cessation.   --cont nicotine patch   Depression/anxiety:  --Continue home Prozac 40 mg daily  --cont home Xanax 1 mg q6h PRN (current Rx)   Hypothyroidism:  --cont home Synthroid   Bipolar disorder:  --cont home Seroquel 400 mg BID   GERD:  --cont home PPI  Underweight, Severe malnutrition in context of chronic illness   Discharge Diagnoses:  Active  Problems:   COPD with acute exacerbation (HCC)   30 Day Unplanned Readmission Risk Score    Flowsheet Row ED to Hosp-Admission (Current) from 05/02/2021 in [facility] (1A)  30 Day Unplanned Readmission Risk Score (%) 15.45 Filed at 05/06/2021 0801       This score is the patient's risk of an unplanned readmission within 30 days of being discharged (0 -100%). The score is based on dignosis, age, lab data, medications, orders, and past utilization.   Low:  0-14.9   Medium: 15-21.9   High: 22-29.9   Extreme: 30  and above         Discharge Instructions:  Allergies as of 05/06/2021       Reactions   Bee Venom Anaphylaxis   Sulfa Antibiotics Itching, Rash, Shortness Of Breath   Redness   Poison Oak Extract    Other reaction(s): Unknown   Sulfasalazine Itching        Medication List     STOP taking these medications    gabapentin 400 MG capsule Commonly known as: NEURONTIN       TAKE these medications    albuterol 1.25 MG/3ML nebulizer solution Commonly known as: ACCUNEB Take 1.25 mg by nebulization 3 (three) times daily as needed for wheezing or shortness of breath.   albuterol 108 (90 Base) MCG/ACT inhaler Commonly known as: VENTOLIN HFA Inhale 2 puffs into the lungs every 4 (four) hours as needed.   ALPRAZolam 1 MG tablet Commonly known as: XANAX Take 1 tablet (1 mg total) by mouth 4 (four) times daily as needed for anxiety. Home med. What changed:  when to take this reasons to take this additional instructions   ascorbic acid 250 MG tablet Commonly known as: VITAMIN C Take 1 tablet (250 mg total) by mouth 2 (two) times daily.   Breo Ellipta 100-25 MCG/INH Aepb Generic drug: fluticasone furoate-vilanterol Inhale 1 puff into the lungs daily.   EPINEPHrine 0.3 mg/0.3 mL Soaj injection Commonly known as: EPI-PEN Inject 0.3 mg into the muscle as needed.   FLUoxetine 40 MG capsule Commonly known as: PROZAC Take 40 mg by mouth daily.   levothyroxine 88 MCG tablet Commonly known as: SYNTHROID Take 88 mcg by mouth daily before breakfast.   multivitamin with minerals Tabs tablet Take 1 tablet by mouth daily.   naproxen 500 MG tablet Commonly known as: NAPROSYN Take 500 mg by mouth 2 (two) times daily as needed.   nicotine 14 mg/24hr patch Commonly known as: NICODERM CQ - dosed in mg/24 hours Place 14 mg onto the skin daily.   omeprazole 40 MG capsule Commonly known as: PRILOSEC Take 40 mg by mouth daily.   QUEtiapine 400 MG tablet Commonly known  as: SEROQUEL Take 400 mg by mouth 2 (two) times daily.   Spiriva HandiHaler 18 MCG inhalation capsule Generic drug: tiotropium Place 1 capsule into inhaler and inhale daily.   tiZANidine 4 MG tablet Commonly known as: ZANAFLEX Take 1 tablet (4 mg total) by mouth 3 (three) times daily as needed for muscle spasms. Home med. What changed:  when to take this reasons to take this additional instructions               Durable Medical Equipment  (From admission, onward)           Start     Ordered   05/05/21 1635  For home use only DME Walker rolling  Once       Question Answer  Comment  Walker: With 5 Inch Wheels   Patient needs a walker to treat with the following condition Weakness      05/05/21 1634             Follow-up Information     Your PCP Follow up in 1 week(s).                  Allergies  Allergen Reactions   Bee Venom Anaphylaxis   Sulfa Antibiotics Itching, Rash and Shortness Of Breath    Redness    Poison Oak Extract     Other reaction(s): Unknown   Sulfasalazine Itching     The results of significant diagnostics from this hospitalization (including imaging, microbiology, ancillary and laboratory) are listed below for reference.   Consultations:   Procedures/Studies: DG Chest Portable 1 View  Result Date: 05/02/2021 CLINICAL DATA:  Shortness of breath and respiratory distress. Home oxygen therapy. Chest pain. EXAM: PORTABLE CHEST 1 VIEW COMPARISON:  04/22/2021 FINDINGS: Severe emphysema. Slightly improved aeration at the site of prior bibasilar airspace opacities. These airspace opacities favor the periphery of the lung bases. Left axillary and breast clips.  Heart size within normal limits. Thoracic kyphosis. IMPRESSION: 1. Mildly improved aeration at the site of the bibasilar airspace opacities favoring mild improvement in multilobar pneumonia. 2. Severe emphysema. 3. Thoracic kyphosis. Electronically Signed   By: Gaylyn RongWalter   Liebkemann M.D.   On: 05/02/2021 11:24   DG Chest Port 1 View  Result Date: 04/22/2021 CLINICAL DATA:  Worsening shortness of breath for 1 week, questionable sepsis, history COPD, hypertension, smoker EXAM: PORTABLE CHEST 1 VIEW COMPARISON:  Portable exam 0843 hours compared to 02/13/2021 FINDINGS: Normal heart size, mediastinal contours, and pulmonary vascularity. Emphysematous and bronchitic changes consistent with COPD. Scattered mild chronic interstitial prominence with new patchy bibasilar infiltrates slightly greater on RIGHT consistent with multifocal pneumonia. No pleural effusion or pneumothorax. Bones demineralized. Surgical clips LEFT breast and LEFT axilla. IMPRESSION: COPD changes with bibasilar infiltrates consistent with multifocal pneumonia. Emphysema (ICD10-J43.9). Electronically Signed   By: Ulyses SouthwardMark  Boles M.D.   On: 04/22/2021 09:12      Labs: BNP (last 3 results) Recent Labs    05/02/21 1031  BNP 60.1   Basic Metabolic Panel: Recent Labs  Lab 05/02/21 1031 05/02/21 2136 05/03/21 0645 05/04/21 0513 05/05/21 0639 05/06/21 0418  NA 140  --  136 140 140 140  K 4.0  --  3.6 4.0 3.4* 3.7  CL 102  --  101 107 105 105  CO2 28  --  24 27 29 26   GLUCOSE 107*  --  100* 83 100* 98  BUN 18  --  31* 25* 27* 22  CREATININE 0.94  --  0.89 0.67 0.77 0.76  CALCIUM 9.8  --  9.0 9.3 9.3 9.2  MG  --  3.0* 2.7* 2.3 2.0 1.8  PHOS  --  3.3 2.7  --   --   --    Liver Function Tests: Recent Labs  Lab 05/02/21 1031  AST 25  ALT 25  ALKPHOS 79  BILITOT 1.1  PROT 7.3  ALBUMIN 4.2   No results for input(s): LIPASE, AMYLASE in the last 168 hours. No results for input(s): AMMONIA in the last 168 hours. CBC: Recent Labs  Lab 05/02/21 1031 05/03/21 0645 05/04/21 0513 05/05/21 0639 05/06/21 0418  WBC 20.9* 16.3* 15.5* 10.4 11.1*  NEUTROABS 18.6* 15.6*  --   --   --   HGB 14.6 13.4 13.0  13.0 14.3  HCT 42.2 37.9 38.8 39.3 42.9  MCV 95.0 95.5 95.6 95.9 97.5  PLT 349 258 229 227  229   Cardiac Enzymes: No results for input(s): CKTOTAL, CKMB, CKMBINDEX, TROPONINI in the last 168 hours. BNP: Invalid input(s): POCBNP CBG: No results for input(s): GLUCAP in the last 168 hours. D-Dimer No results for input(s): DDIMER in the last 72 hours. Hgb A1c No results for input(s): HGBA1C in the last 72 hours. Lipid Profile No results for input(s): CHOL, HDL, LDLCALC, TRIG, CHOLHDL, LDLDIRECT in the last 72 hours. Thyroid function studies No results for input(s): TSH, T4TOTAL, T3FREE, THYROIDAB in the last 72 hours.  Invalid input(s): FREET3 Anemia work up No results for input(s): VITAMINB12, FOLATE, FERRITIN, TIBC, IRON, RETICCTPCT in the last 72 hours. Urinalysis    Component Value Date/Time   COLORURINE YELLOW (A) 04/22/2021 0945   APPEARANCEUR CLEAR (A) 04/22/2021 0945   LABSPEC 1.010 04/22/2021 0945   PHURINE 6.0 04/22/2021 0945   GLUCOSEU NEGATIVE 04/22/2021 0945   HGBUR NEGATIVE 04/22/2021 0945   BILIRUBINUR NEGATIVE 04/22/2021 0945   KETONESUR NEGATIVE 04/22/2021 0945   PROTEINUR NEGATIVE 04/22/2021 0945   NITRITE NEGATIVE 04/22/2021 0945   LEUKOCYTESUR NEGATIVE 04/22/2021 0945   Sepsis Labs Invalid input(s): PROCALCITONIN,  WBC,  LACTICIDVEN Microbiology Recent Results (from the past 240 hour(s))  Resp Panel by RT-PCR (Flu A&B, Covid) Nasopharyngeal Swab     Status: None   Collection Time: 05/02/21 10:30 AM   Specimen: Nasopharyngeal Swab; Nasopharyngeal(NP) swabs in vial transport medium  Result Value Ref Range Status   SARS Coronavirus 2 by RT PCR NEGATIVE NEGATIVE Final    Comment: (NOTE) SARS-CoV-2 target nucleic acids are NOT DETECTED.  The SARS-CoV-2 RNA is generally detectable in upper respiratory specimens during the acute phase of infection. The lowest concentration of SARS-CoV-2 viral copies this assay can detect is 138 copies/mL. A negative result does not preclude SARS-Cov-2 infection and should not be used as the sole basis for  treatment or other patient management decisions. A negative result may occur with  improper specimen collection/handling, submission of specimen other than nasopharyngeal swab, presence of viral mutation(s) within the areas targeted by this assay, and inadequate number of viral copies(<138 copies/mL). A negative result must be combined with clinical observations, patient history, and epidemiological information. The expected result is Negative.  Fact Sheet for Patients:  BloggerCourse.com  Fact Sheet for Healthcare Providers:  SeriousBroker.it  This test is no t yet approved or cleared by the [place] FDA and  has been authorized for detection and/or diagnosis of SARS-CoV-2 by FDA under an Emergency Use Authorization (EUA). This EUA will remain  in effect (meaning this test can be used) for the duration of the COVID-19 declaration under Section 564(b)(1) of the Act, 21 U.S.C.section 360bbb-3(b)(1), unless the authorization is terminated  or revoked sooner.       Influenza A by PCR NEGATIVE NEGATIVE Final   Influenza B by PCR NEGATIVE NEGATIVE Final    Comment: (NOTE) The Xpert Xpress SARS-CoV-2/FLU/RSV plus assay is intended as an aid in the diagnosis of influenza from Nasopharyngeal swab specimens and should not be used as a sole basis for treatment. Nasal washings and aspirates are unacceptable for Xpert Xpress SARS-CoV-2/FLU/RSV testing.  Fact Sheet for Patients: BloggerCourse.com  Fact Sheet for Healthcare Providers: SeriousBroker.it  This test is not yet approved or cleared by the [place] FDA and has been authorized for detection and/or diagnosis of SARS-CoV-2 by FDA under an Emergency Use  Authorization (EUA). This EUA will remain in effect (meaning this test can be used) for the duration of the COVID-19 declaration under Section 564(b)(1) of the Act, 21  U.S.C. section 360bbb-3(b)(1), unless the authorization is terminated or revoked.  Performed at [facility], [address] [place], [place] [address]   Respiratory (~20 pathogens) panel by PCR     Status: None   Collection Time: 05/02/21 12:39 PM   Specimen: Nasopharyngeal Swab; Respiratory  Result Value Ref Range Status   Adenovirus NOT DETECTED NOT DETECTED Final   Coronavirus 229E NOT DETECTED NOT DETECTED Final    Comment: (NOTE) The Coronavirus on the Respiratory Panel, DOES NOT test for the novel  Coronavirus (2019 nCoV)    Coronavirus HKU1 NOT DETECTED NOT DETECTED Final   Coronavirus NL63 NOT DETECTED NOT DETECTED Final   Coronavirus OC43 NOT DETECTED NOT DETECTED Final   Metapneumovirus NOT DETECTED NOT DETECTED Final   Rhinovirus / Enterovirus NOT DETECTED NOT DETECTED Final   Influenza A NOT DETECTED NOT DETECTED Final   Influenza B NOT DETECTED NOT DETECTED Final   Parainfluenza Virus 1 NOT DETECTED NOT DETECTED Final   Parainfluenza Virus 2 NOT DETECTED NOT DETECTED Final   Parainfluenza Virus 3 NOT DETECTED NOT DETECTED Final   Parainfluenza Virus 4 NOT DETECTED NOT DETECTED Final   Respiratory Syncytial Virus NOT DETECTED NOT DETECTED Final   Bordetella pertussis NOT DETECTED NOT DETECTED Final   Bordetella Parapertussis NOT DETECTED NOT DETECTED Final   Chlamydophila pneumoniae NOT DETECTED NOT DETECTED Final   Mycoplasma pneumoniae NOT DETECTED NOT DETECTED Final    Comment: Performed at [facility], [address] [place], [place] [address]  MRSA Next Gen by PCR, Nasal     Status: None   Collection Time: 05/02/21 12:39 PM   Specimen: Nasopharyngeal Swab; Nasal Swab  Result Value Ref Range Status   MRSA by PCR Next Gen NOT DETECTED NOT DETECTED Final    Comment: (NOTE) The GeneXpert MRSA Assay (FDA approved for NASAL specimens only), is one component of a comprehensive MRSA colonization surveillance program. It is not intended to  diagnose MRSA infection nor to guide or monitor treatment for MRSA infections. Test performance is not FDA approved in patients less than 40 years old. Performed at [facility], [address] [place], [place] [address]      Total time spend on discharging this patient, including the last patient exam, discussing the hospital stay, instructions for ongoing care as it relates to all pertinent caregivers, as well as preparing the medical discharge records, prescriptions, and/or referrals as applicable, is 45 minutes.    Darlin Priestly, MD  [facility] 05/06/2021, 9:34 AM

## 2021-05-06 NOTE — Progress Notes (Signed)
Discharge instructions reviewed with patient, verbalized understanding. IV removed. John, son, notified by this nurse of patient's discharge. States he will arrange pick up for patient this afternoon. Stated that her oxygen concentrator is charging and when charged, patient will be picked up. Will continue to monitor.

## 2021-08-19 IMAGING — DX DG CHEST 1V PORT
1 series · 1 of 1 positions shown · non-contrast
Comparison: 04/22/2021

CLINICAL DATA: Shortness of breath and respiratory distress. Home
oxygen therapy. Chest pain.

EXAM:
PORTABLE CHEST 1 VIEW

[chest ap]
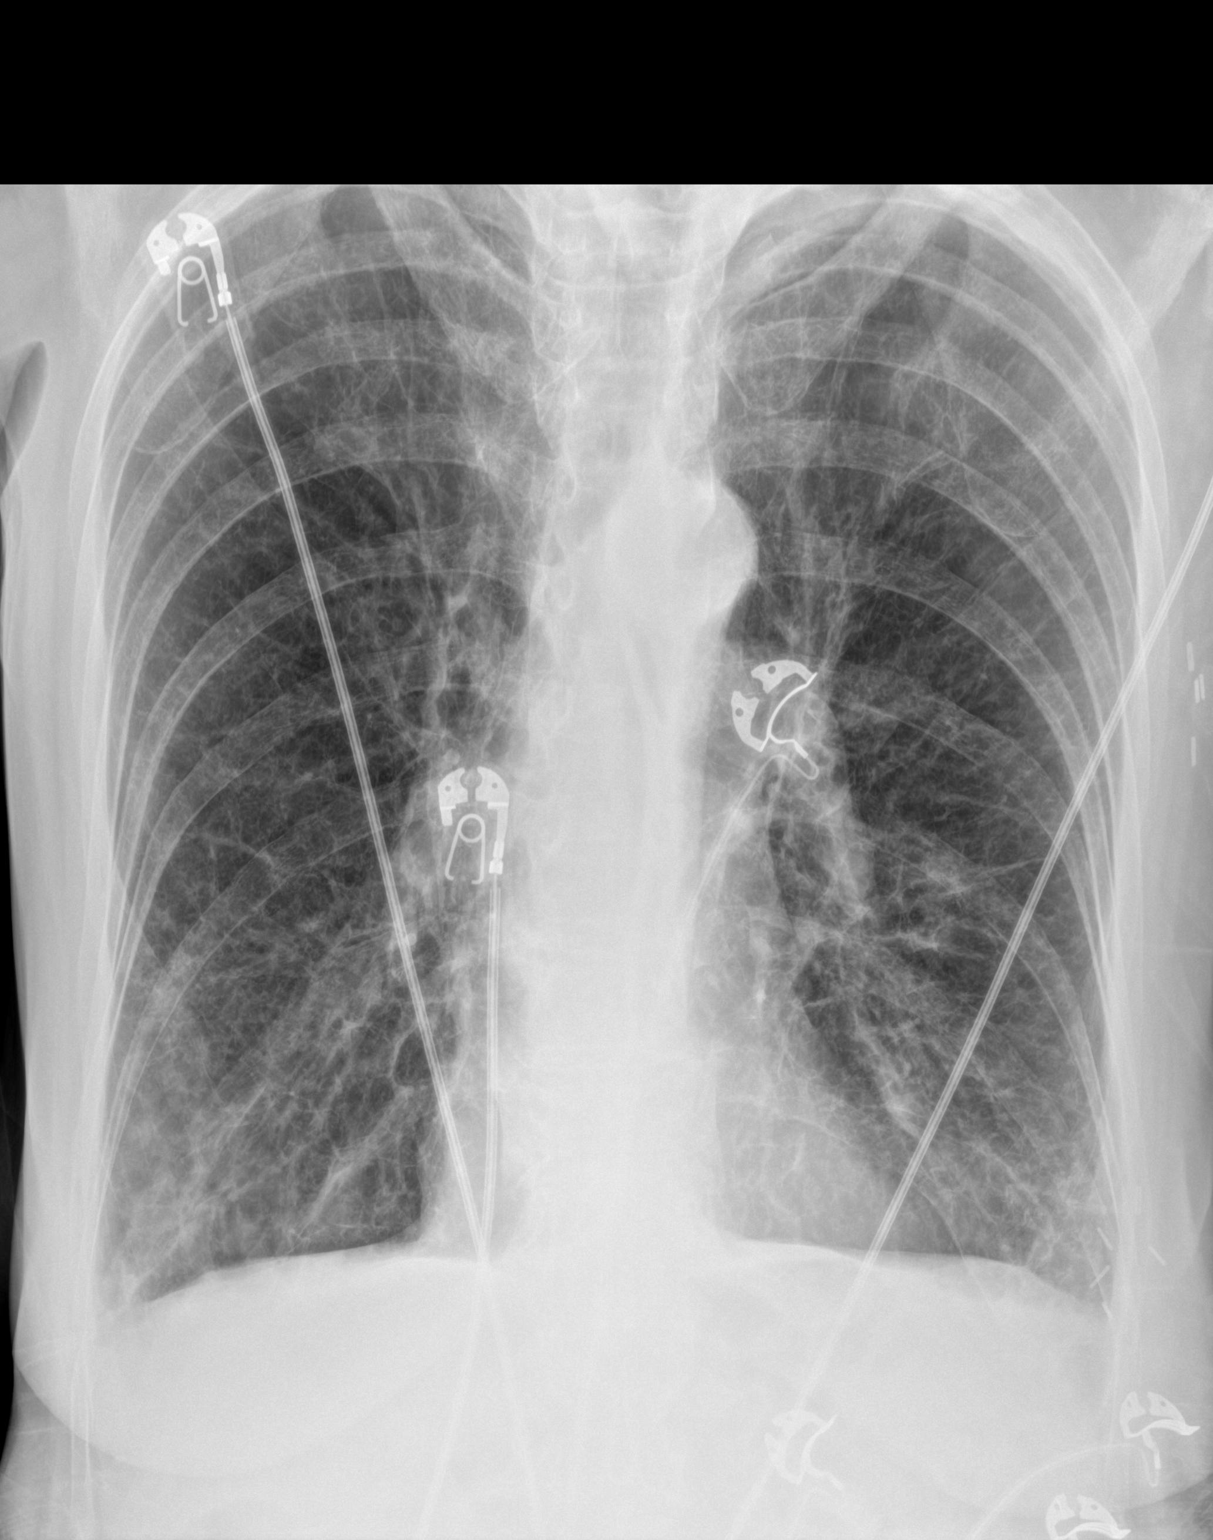

[1 of 1 positions shown; findings below may reference images not displayed]

FINDINGS: Severe emphysema.

Slightly improved aeration at the site of prior bibasilar airspace
opacities. These airspace opacities favor the periphery of the lung
bases.

Left axillary and breast clips.  Heart size within normal limits.

Thoracic kyphosis.
IMPRESSION: 1. Mildly improved aeration at the site of the bibasilar airspace
opacities favoring mild improvement in multilobar pneumonia.
2. Severe emphysema.
3. Thoracic kyphosis.

## 2021-11-12 DEATH — deceased
